# Patient Record
Sex: Female | Born: 1950
Health system: Southern US, Community
[De-identification: ages and names within clinical notes are randomized; demographics above are authoritative.]

## PROBLEM LIST (undated history)

## (undated) DIAGNOSIS — T7840XA Allergy, unspecified, initial encounter: Secondary | ICD-10-CM

## (undated) DIAGNOSIS — Z8719 Personal history of other diseases of the digestive system: Secondary | ICD-10-CM

## (undated) DIAGNOSIS — M1990 Unspecified osteoarthritis, unspecified site: Secondary | ICD-10-CM

## (undated) DIAGNOSIS — Z780 Asymptomatic menopausal state: Secondary | ICD-10-CM

## (undated) DIAGNOSIS — K219 Gastro-esophageal reflux disease without esophagitis: Secondary | ICD-10-CM

## (undated) DIAGNOSIS — E119 Type 2 diabetes mellitus without complications: Secondary | ICD-10-CM

## (undated) DIAGNOSIS — Z8711 Personal history of peptic ulcer disease: Secondary | ICD-10-CM

## (undated) DIAGNOSIS — H9190 Unspecified hearing loss, unspecified ear: Secondary | ICD-10-CM

## (undated) DIAGNOSIS — M810 Age-related osteoporosis without current pathological fracture: Secondary | ICD-10-CM

## (undated) DIAGNOSIS — E785 Hyperlipidemia, unspecified: Secondary | ICD-10-CM

## (undated) DIAGNOSIS — I10 Essential (primary) hypertension: Secondary | ICD-10-CM

## (undated) HISTORY — DX: Personal history of peptic ulcer disease: Z87.11

## (undated) HISTORY — DX: Allergy, unspecified, initial encounter: T78.40XA

## (undated) HISTORY — DX: Essential (primary) hypertension: I10

## (undated) HISTORY — DX: Personal history of other diseases of the digestive system: Z87.19

## (undated) HISTORY — DX: Unspecified osteoarthritis, unspecified site: M19.90

## (undated) HISTORY — PX: BREAST SURGERY: SHX581

## (undated) HISTORY — DX: Gastro-esophageal reflux disease without esophagitis: K21.9

## (undated) HISTORY — DX: Hyperlipidemia, unspecified: E78.5

## (undated) HISTORY — DX: Asymptomatic menopausal state: Z78.0

## (undated) HISTORY — DX: Age-related osteoporosis without current pathological fracture: M81.0

## (undated) HISTORY — DX: Unspecified hearing loss, unspecified ear: H91.90

## (undated) HISTORY — DX: Type 2 diabetes mellitus without complications: E11.9

---

## 1984-10-26 HISTORY — PX: TUBAL LIGATION: SHX77

## 2008-01-25 HISTORY — PX: COLONOSCOPY: SHX174

## 2008-10-26 HISTORY — PX: CARDIAC CATHETERIZATION: SHX172

## 2014-10-04 ENCOUNTER — Encounter: Payer: Self-pay | Admitting: Family Medicine

## 2015-11-19 DIAGNOSIS — R0602 Shortness of breath: Secondary | ICD-10-CM | POA: Diagnosis not present

## 2015-12-12 DIAGNOSIS — E7800 Pure hypercholesterolemia, unspecified: Secondary | ICD-10-CM | POA: Diagnosis not present

## 2015-12-12 DIAGNOSIS — R7303 Prediabetes: Secondary | ICD-10-CM | POA: Diagnosis not present

## 2015-12-12 DIAGNOSIS — R0602 Shortness of breath: Secondary | ICD-10-CM | POA: Diagnosis not present

## 2015-12-12 DIAGNOSIS — Z8249 Family history of ischemic heart disease and other diseases of the circulatory system: Secondary | ICD-10-CM | POA: Diagnosis not present

## 2016-03-03 DIAGNOSIS — W57XXXA Bitten or stung by nonvenomous insect and other nonvenomous arthropods, initial encounter: Secondary | ICD-10-CM | POA: Diagnosis not present

## 2016-03-03 DIAGNOSIS — M2550 Pain in unspecified joint: Secondary | ICD-10-CM | POA: Diagnosis not present

## 2016-03-03 DIAGNOSIS — S30861A Insect bite (nonvenomous) of abdominal wall, initial encounter: Secondary | ICD-10-CM | POA: Diagnosis not present

## 2016-03-17 DIAGNOSIS — Z86010 Personal history of colonic polyps: Secondary | ICD-10-CM | POA: Diagnosis not present

## 2016-03-17 DIAGNOSIS — Z1211 Encounter for screening for malignant neoplasm of colon: Secondary | ICD-10-CM | POA: Diagnosis not present

## 2016-03-17 DIAGNOSIS — Z800 Family history of malignant neoplasm of digestive organs: Secondary | ICD-10-CM | POA: Diagnosis not present

## 2016-03-17 DIAGNOSIS — R194 Change in bowel habit: Secondary | ICD-10-CM | POA: Diagnosis not present

## 2016-03-17 DIAGNOSIS — R152 Fecal urgency: Secondary | ICD-10-CM | POA: Diagnosis not present

## 2016-04-07 ENCOUNTER — Emergency Department (HOSPITAL_COMMUNITY)
Admission: EM | Admit: 2016-04-07 | Discharge: 2016-04-07 | Disposition: A | Discharge status: Home / Self Care | Payer: PPO | Attending: Emergency Medicine | Admitting: Emergency Medicine

## 2016-04-07 ENCOUNTER — Emergency Department (HOSPITAL_COMMUNITY): Payer: PPO

## 2016-04-07 ENCOUNTER — Encounter (HOSPITAL_COMMUNITY): Payer: Self-pay | Admitting: Emergency Medicine

## 2016-04-07 DIAGNOSIS — W540XXA Bitten by dog, initial encounter: Secondary | ICD-10-CM | POA: Diagnosis not present

## 2016-04-07 DIAGNOSIS — S81852A Open bite, left lower leg, initial encounter: Secondary | ICD-10-CM | POA: Diagnosis not present

## 2016-04-07 DIAGNOSIS — S81812A Laceration without foreign body, left lower leg, initial encounter: Secondary | ICD-10-CM | POA: Insufficient documentation

## 2016-04-07 DIAGNOSIS — Y999 Unspecified external cause status: Secondary | ICD-10-CM | POA: Insufficient documentation

## 2016-04-07 DIAGNOSIS — E119 Type 2 diabetes mellitus without complications: Secondary | ICD-10-CM | POA: Insufficient documentation

## 2016-04-07 DIAGNOSIS — Y929 Unspecified place or not applicable: Secondary | ICD-10-CM | POA: Insufficient documentation

## 2016-04-07 DIAGNOSIS — I10 Essential (primary) hypertension: Secondary | ICD-10-CM | POA: Diagnosis not present

## 2016-04-07 DIAGNOSIS — M810 Age-related osteoporosis without current pathological fracture: Secondary | ICD-10-CM | POA: Diagnosis not present

## 2016-04-07 DIAGNOSIS — E785 Hyperlipidemia, unspecified: Secondary | ICD-10-CM | POA: Insufficient documentation

## 2016-04-07 DIAGNOSIS — S80879A Other superficial bite, unspecified lower leg, initial encounter: Secondary | ICD-10-CM | POA: Diagnosis not present

## 2016-04-07 DIAGNOSIS — Y939 Activity, unspecified: Secondary | ICD-10-CM | POA: Diagnosis not present

## 2016-04-07 MED ORDER — HYDROMORPHONE HCL 1 MG/ML IJ SOLN
INTRAMUSCULAR | Status: AC
  Administered 2016-04-07: 1 mg
  Filled 2016-04-07: qty 1

## 2016-04-07 MED ORDER — TETANUS-DIPHTH-ACELL PERTUSSIS 5-2.5-18.5 LF-MCG/0.5 IM SUSP
0.5000 mL | Freq: Once | INTRAMUSCULAR | Status: AC
  Administered 2016-04-07: 0.5 mL via INTRAMUSCULAR
  Filled 2016-04-07: qty 0.5

## 2016-04-07 MED ORDER — SODIUM CHLORIDE 0.9 % IV SOLN
3.0000 g | Freq: Once | INTRAVENOUS | Status: AC
  Administered 2016-04-07: 3 g via INTRAVENOUS
  Filled 2016-04-07: qty 3

## 2016-04-07 MED ORDER — BUPIVACAINE HCL (PF) 0.5 % IJ SOLN
INTRAMUSCULAR | Status: AC
  Administered 2016-04-07: 20:00:00
  Filled 2016-04-07: qty 30

## 2016-04-07 MED ORDER — HYDROCODONE-ACETAMINOPHEN 5-325 MG PO TABS: 1.0000 | ORAL_TABLET | ORAL | Status: DC | PRN

## 2016-04-07 MED ORDER — BUPIVACAINE HCL 0.5 % IJ SOLN
50.0000 mL | Freq: Once | INTRAMUSCULAR | Status: DC
  Filled 2016-04-07: qty 50

## 2016-04-07 MED ORDER — AMOXICILLIN-POT CLAVULANATE 875-125 MG PO TABS: 1.0000 | ORAL_TABLET | Freq: Two times a day (BID) | ORAL | Status: DC

## 2016-04-07 MED ORDER — BUPIVACAINE HCL (PF) 0.5 % IJ SOLN
INTRAMUSCULAR | Status: AC
  Administered 2016-04-07: 21:00:00
  Filled 2016-04-07: qty 30

## 2016-04-07 MED ORDER — LORAZEPAM 2 MG/ML IJ SOLN
1.0000 mg | Freq: Once | INTRAMUSCULAR | Status: AC
  Administered 2016-04-07: 1 mg via INTRAVENOUS
  Filled 2016-04-07: qty 1

## 2016-04-07 NOTE — ED Notes (Signed)
Pt reports being bitten by her dog to her LT leg. EMS reports that pt has one large laceration and 1 puncture wound. Bleeding controlled with pressure.

## 2016-04-07 NOTE — Discharge Instructions (Signed)
Please return to the emergency department to have your wound check in 3 days. Please have the staples removed in 10 days. Please use Augmentin 2 times daily with food. Use Tylenol or ibuprofen for mild pain, use Norco for more severe pain. Norco may cause drowsiness, please use this medication with caution. Animal Bite Animal bite wounds can get infected. It is important to get proper medical treatment. Ask your doctor if you need rabies treatment. HOME CARE  Wound Care  Follow instructions from your doctor about how to take care of your wound. Make sure you:  Wash your hands with soap and water before you change your bandage (dressing). If you cannot use soap and water, use hand sanitizer.  Change your bandage as told by your doctor.  Leave stitches (sutures), skin glue, or skin tape (adhesive) strips in place. They may need to stay in place for 2 weeks or longer. If tape strips get loose and curl up, you may trim the loose edges. Do not remove tape strips completely unless your doctor says it is okay.  Check your wound every day for signs of infection. Watch for:    Redness, swelling, or pain that gets worse.    Fluid, blood, or pus.  General Instructions  Take or apply over-the-counter and prescription medicines only as told by your doctor.   If you were prescribed an antibiotic, take or apply it as told by your doctor. Do not stop using the antibiotic even if your condition improves.   Keep the injured area raised (elevated) above the level of your heart while you are sitting or lying down.  If directed, apply ice to the injured area.    Put ice in a plastic bag.    Place a towel between your skin and the bag.    Leave the ice on for 20 minutes, 2-3 times per day.   Keep all follow-up visits as told by your doctor. This is important.  GET HELP IF:  You have redness, swelling, or pain that gets worse.   You have a general feeling of sickness (malaise).    You feel sick to your stomach (nauseous).  You throw up (vomit).   You have pain that does not get better.  GET HELP RIGHT AWAY IF:   You have a red streak going away from your wound.   You have fluid, blood, or pus coming from your wound.   You have a fever or chills.   You have trouble moving your injured area.   You have numbness or tingling anywhere on your body.    This information is not intended to replace advice given to you by your health care provider. Make sure you discuss any questions you have with your health care provider.   Document Released: 10/12/2005 Document Revised: 07/03/2015 Document Reviewed: 02/27/2015 Elsevier Interactive Patient Education [employer].

## 2016-04-07 NOTE — ED Notes (Signed)
Pt alert & oriented x4, stable gait. Patient given discharge instructions, paperwork & prescription(s). Patient informed not to drive, operate any equipment & handel any important documents 4 hours after taking pain medication. Patient  instructed to stop at the registration desk to finish any additional paperwork. Patient  verbalized understanding. Pt left department w/ no further questions. 

## 2016-04-08 NOTE — ED Provider Notes (Signed)
CSN: [id number]     Arrival date & time 04/07/16  1727 History   First MD Initiated Contact with Patient 04/07/16 1742     Chief Complaint  Patient presents with  . Animal Bite     (Consider location/radiation/quality/duration/timing/severity/associated sxs/prior Treatment) Patient is a 65 y.o. female presenting with animal bite. The history is provided by the patient (Patient states that her own dog bit her on the left leg. Physical car today).  Animal Bite Contact animal:  Dog Pain details:    Quality:  Aching   Severity:  Moderate   Timing:  Constant   Progression:  Unchanged Incident location:  Home Provoked: Patient was trying to break up a fight amongst the dog's.   Notifications:  None Animal's rabies vaccination status:  Up to date Associated symptoms: no rash     Past Medical History  Diagnosis Date  . Diabetes mellitus without complication (HCC)     prediabetes  . Hypertension   . Hyperlipidemia   . Neuromuscular disorder ([place])   . Osteoporosis   . History of stomach ulcers   . Arthritis   . Menopause   . Hearing loss    Past Surgical History  Procedure Laterality Date  . Colon surgery  2010    colonoscopy every 5 years  . Tubal ligation  1986   Family History  Problem Relation Age of Onset  . Arthritis Mother   . Cancer Mother   . Early death Mother   . Heart disease Mother   . Hyperlipidemia Mother   . Hypertension Mother   . Miscarriages / [place] Mother   . Stroke Mother   . Heart disease Father   . Arthritis Sister   . Depression Sister   . Early death Sister   . Heart disease Sister   . Hyperlipidemia Sister   . Alcohol abuse Brother   . Early death Brother   . Diabetes Son   . Arthritis Maternal Grandmother   . Heart disease Maternal Grandmother   . Arthritis Maternal Grandfather   . Heart disease Maternal Grandfather   . Arthritis Paternal Grandmother   . Heart disease Paternal Grandmother   . Arthritis Paternal Grandfather    . Heart disease Paternal Grandfather   . Asthma Sister   . Cancer Sister   . Depression Sister   . Hyperlipidemia Sister   . Stroke Sister   . Cancer Sister   . Depression Sister   . Hyperlipidemia Sister   . COPD Sister   . Depression Sister   . Hyperlipidemia Sister    Social History  Substance Use Topics  . Smoking status: Never Smoker   . Smokeless tobacco: None  . Alcohol Use: No   OB History    No data available     Review of Systems  Constitutional: Negative for appetite change and fatigue.  HENT: Negative for congestion, ear discharge and sinus pressure.   Eyes: Negative for discharge.  Respiratory: Negative for cough.   Cardiovascular: Negative for chest pain.  Gastrointestinal: Negative for abdominal pain and diarrhea.  Genitourinary: Negative for frequency and hematuria.  Musculoskeletal: Negative for back pain.       Laceration left lower leg  Skin: Negative for rash.  Neurological: Negative for seizures and headaches.  Psychiatric/Behavioral: Negative for hallucinations.      Allergies  Percocet  Home Medications   Prior to Admission medications   Medication Sig Start Date End Date Taking? Authorizing Provider  alendronate (FOSAMAX) 5 MG  tablet Take 5 mg by mouth once a week. Take with a full glass of water on an empty stomach.    Historical Provider, MD  amoxicillin-clavulanate (AUGMENTIN) 875-125 MG tablet Take 1 tablet by mouth 2 (two) times daily. 04/07/16   Lily Kocher, PA-C  aspirin 325 MG tablet Take 325 mg by mouth every 6 (six) hours as needed.    Historical Provider, MD  calcium-vitamin D (OSCAL WITH D) 500-200 MG-UNIT per tablet Take 1 tablet by mouth.    Historical Provider, MD  HYDROcodone-acetaminophen (NORCO/VICODIN) 5-325 MG tablet Take 1-2 tablets by mouth every 4 (four) hours as needed. 04/07/16   Lily Kocher, PA-C  HYDROcodone-acetaminophen (NORCO/VICODIN) 5-325 MG tablet Take 1 tablet by mouth every 4 (four) hours as needed.  04/07/16   Lily Kocher, PA-C  Multiple Vitamin (MULTIVITAMIN) capsule Take 1 capsule by mouth daily.    Historical Provider, MD  traMADol (ULTRAM) 50 MG tablet Take by mouth every 6 (six) hours as needed.    Historical Provider, MD   BP 132/78 mmHg  Pulse 68  Temp(Src) 98.7 F (37.1 C) (Oral)  Resp 17  Ht 5\' 5"  (1.651 m)  Wt 164 lb (74.39 kg)  BMI 27.29 kg/m2  SpO2 99% Physical Exam  Constitutional: She is oriented to person, place, and time. She appears well-developed.  HENT:  Head: Normocephalic.  Eyes: Conjunctivae are normal.  Neck: No tracheal deviation present.  Cardiovascular:  No murmur heard. Musculoskeletal: Normal range of motion.  Patient has a large superficial laceration to left calf. It is approximately 6 cm. Neurovascular exam normal  Neurological: She is oriented to person, place, and time.  Skin: Skin is warm.  Psychiatric: She has a normal mood and affect.    ED Course  Procedures (including critical care time) Labs Review Labs Reviewed - No data to display  Imaging Review Dg Tibia/fibula Left  04/07/2016  CLINICAL DATA:  Pt reports being bitten by her dog to her LT leg. EMS reports that pt has one large laceration and 1 puncture wound. Bleeding controlled with pressure EXAM: LEFT TIBIA AND FIBULA - 2 VIEW COMPARISON:  None. FINDINGS: There is a soft tissue wound along the posterior aspect of the lower calf. No radiopaque foreign body. No fracture.  No bone lesion. Knee and ankle joints are normally aligned. IMPRESSION: 1. No fracture or dislocation. 2. No radiopaque foreign body. Electronically Signed   By: Lajean Manes M.D.   On: 04/07/2016 18:51   I have personally reviewed and evaluated these images and lab results as part of my medical decision-making.   EKG Interpretation None      MDM   Final diagnoses:  Animal bite of lower leg, left, initial encounter    Patient with large laceration from dog bite. Laceration is to left calf. Area was  cleaned thoroughly laceration was closed by physician assistant Lily Kocher. Patient was put on antibiotics and is to follow-up in a couple days for recheck    Milton Ferguson, MD 04/08/16 1207

## 2016-04-09 NOTE — ED Provider Notes (Signed)
Wound Repairs Left Leg. Patient is a 65 year old female who sustained dog bites/punctures to the left lower leg.  I reviewed the x-rays. There is no bone involvement. There is no radiopaque foreign body present.  The procedure was explained to the patient in terms which she understands. She is admission to proceed with the procedure. The patient was somewhat anxious and was given a milligram of Ativan prior to the procedure starting.  The patient was identified by arm band. The 3 lacerations/wound areas were irrigated with a liter of saline. They were then painted with Betadine. The wounds were infiltrated with 0.5% bupivacaine. Following this the wounds were scrubbed with surgical brush. And then irrigated a second time.  The first laceration measured 2.2 cm. It was explored to rule out foreign body. There is no tendon involvement. Using sterile technique the wound was repaired with 3 staples.  The second laceration is measured 12.2 cm. There was a wedge of skin that was a fall. During the bite. There is no bone involvement or tendon involvement. No foreign body found. The wound was repaired with a 23 staples using sterile technique..  The third wound measured 2.7 cm. There was no bone bone or tendon involvement. No foreign body noted on evaluation on. Using sterile technique it was repaired with 7 staples.  The wounds were dressed with nonstick dressings on. The patient will be treated with Augmentin, and Norco. The patient is to be rechecked here in the emergency department in 3 days, and have the staples removed in 10 days. Patient tolerated the procedure without problem.   Lily Kocher, PA-C 04/09/16 2212  Milton Ferguson, MD 04/15/16 [phone]

## 2016-04-10 ENCOUNTER — Emergency Department (HOSPITAL_COMMUNITY)
Admission: EM | Admit: 2016-04-10 | Discharge: 2016-04-10 | Disposition: A | Discharge status: Home / Self Care | Payer: PPO | Attending: Emergency Medicine | Admitting: Emergency Medicine

## 2016-04-10 ENCOUNTER — Encounter (HOSPITAL_COMMUNITY): Payer: Self-pay

## 2016-04-10 DIAGNOSIS — Z4802 Encounter for removal of sutures: Secondary | ICD-10-CM | POA: Diagnosis not present

## 2016-04-10 DIAGNOSIS — Z4801 Encounter for change or removal of surgical wound dressing: Secondary | ICD-10-CM | POA: Insufficient documentation

## 2016-04-10 DIAGNOSIS — Z5189 Encounter for other specified aftercare: Secondary | ICD-10-CM

## 2016-04-10 NOTE — ED Provider Notes (Signed)
CSN: [id number]     Arrival date & time 04/10/16  1759 History   First MD Initiated Contact with Patient 04/10/16 1937     Chief Complaint  Patient presents with  . Wound Check     (Consider location/radiation/quality/duration/timing/severity/associated sxs/prior Treatment) HPI Kelsey Cook is a 65 y.o. female who presents to the ED for wound check 3 days after she was bitten by a dog. She was instructed to return. She has multiple staples to the left lower leg. She denies any problems such as fever, increased pain or redness. She does have swelling to the lower leg but states that is because she has been sitting in the waiting room with the leg down.   Past Medical History  Diagnosis Date  . Diabetes mellitus without complication (HCC)     prediabetes  . Hypertension   . Hyperlipidemia   . Neuromuscular disorder ([place])   . Osteoporosis   . History of stomach ulcers   . Arthritis   . Menopause   . Hearing loss    Past Surgical History  Procedure Laterality Date  . Colon surgery  2010    colonoscopy every 5 years  . Tubal ligation  1986   Family History  Problem Relation Age of Onset  . Arthritis Mother   . Cancer Mother   . Early death Mother   . Heart disease Mother   . Hyperlipidemia Mother   . Hypertension Mother   . Miscarriages / [place] Mother   . Stroke Mother   . Heart disease Father   . Arthritis Sister   . Depression Sister   . Early death Sister   . Heart disease Sister   . Hyperlipidemia Sister   . Alcohol abuse Brother   . Early death Brother   . Diabetes Son   . Arthritis Maternal Grandmother   . Heart disease Maternal Grandmother   . Arthritis Maternal Grandfather   . Heart disease Maternal Grandfather   . Arthritis Paternal Grandmother   . Heart disease Paternal Grandmother   . Arthritis Paternal Grandfather   . Heart disease Paternal Grandfather   . Asthma Sister   . Cancer Sister   . Depression Sister   . Hyperlipidemia Sister   .  Stroke Sister   . Cancer Sister   . Depression Sister   . Hyperlipidemia Sister   . COPD Sister   . Depression Sister   . Hyperlipidemia Sister    Social History  Substance Use Topics  . Smoking status: Never Smoker   . Smokeless tobacco: None  . Alcohol Use: No   OB History    No data available     Review of Systems Negative except as stated in HPI   Allergies  Hydrocodone and Percocet  Home Medications   Prior to Admission medications   Medication Sig Start Date End Date Taking? Authorizing Provider  alendronate (FOSAMAX) 5 MG tablet Take 5 mg by mouth once a week. Take with a full glass of water on an empty stomach.    Historical Provider, MD  amoxicillin-clavulanate (AUGMENTIN) 875-125 MG tablet Take 1 tablet by mouth 2 (two) times daily. 04/07/16   Lily Kocher, PA-C  aspirin 325 MG tablet Take 325 mg by mouth every 6 (six) hours as needed.    Historical Provider, MD  calcium-vitamin D (OSCAL WITH D) 500-200 MG-UNIT per tablet Take 1 tablet by mouth.    Historical Provider, MD  HYDROcodone-acetaminophen (NORCO/VICODIN) 5-325 MG tablet Take 1-2 tablets by mouth  every 4 (four) hours as needed. 04/07/16   Lily Kocher, PA-C  HYDROcodone-acetaminophen (NORCO/VICODIN) 5-325 MG tablet Take 1 tablet by mouth every 4 (four) hours as needed. 04/07/16   Lily Kocher, PA-C  Multiple Vitamin (MULTIVITAMIN) capsule Take 1 capsule by mouth daily.    Historical Provider, MD  traMADol (ULTRAM) 50 MG tablet Take by mouth every 6 (six) hours as needed.    Historical Provider, MD   BP 133/85 mmHg  Pulse 96  Temp(Src) 97.9 F (36.6 C) (Oral)  Resp 18  Ht 5' 5.5" (1.664 m)  Wt 69.854 kg  BMI 25.23 kg/m2  SpO2 93% Physical Exam  Constitutional: She is oriented to person, place, and time. She appears well-developed and well-nourished.  HENT:  Head: Normocephalic and atraumatic.  Eyes: Conjunctivae and EOM are normal.  Neck: Neck supple.  Cardiovascular: Normal rate.    Pulmonary/Chest: Effort normal.  Musculoskeletal:       Left lower leg: She exhibits laceration.  Staples in place left lower leg. There is edema noted but no erythema or red streaking. Pedal pulse 2+, adequate circulation.   Neurological: She is alert and oriented to person, place, and time. No cranial nerve deficit.  Skin: Skin is warm and dry.  Psychiatric: She has a normal mood and affect. Her behavior is normal.  Nursing note and vitals reviewed.   ED Course  Procedures (including critical care time) Labs Review Labs Reviewed - No data to display   MDM  65 y.o. female here for wound check stable for d/c without fever or other signs of infection. She will continue her Augmentin and return as scheduled for staple removal. She will return sooner for any problems.   Final diagnoses:  Encounter for wound re-check        Ashley Murrain, NP 04/11/16 [id number]  Ezequiel Essex, MD 04/12/16 [phone]

## 2016-04-10 NOTE — ED Notes (Signed)
Pt states bloody drainage from staples, denies pus. Swelling noted to ankle, pt states she has left leg in dependent position while waiting and has been walking today.

## 2016-04-10 NOTE — ED Notes (Signed)
Pt reports was bit by her dog 3 days ago and has multiple laceration to left lower leg that was closed with staples.  Areas are a little red and slightly swollen but no drainage noted.

## 2016-04-10 NOTE — Discharge Instructions (Signed)
Continue to take the antibiotic and ibuprofen. Clean the wound as directed. Return immediately for fever, increased redness, warmth or red streaking or other problems.  Follow up in 7 days for staple removal.

## 2016-04-13 DIAGNOSIS — K5190 Ulcerative colitis, unspecified, without complications: Secondary | ICD-10-CM | POA: Diagnosis not present

## 2016-04-13 DIAGNOSIS — K625 Hemorrhage of anus and rectum: Secondary | ICD-10-CM | POA: Diagnosis not present

## 2016-04-17 ENCOUNTER — Emergency Department (HOSPITAL_COMMUNITY)
Admission: EM | Admit: 2016-04-17 | Discharge: 2016-04-17 | Disposition: A | Discharge status: Home / Self Care | Payer: PPO | Attending: Emergency Medicine | Admitting: Emergency Medicine

## 2016-04-17 ENCOUNTER — Encounter (HOSPITAL_COMMUNITY): Payer: Self-pay | Admitting: Emergency Medicine

## 2016-04-17 DIAGNOSIS — Z4802 Encounter for removal of sutures: Secondary | ICD-10-CM

## 2016-04-17 DIAGNOSIS — Z79899 Other long term (current) drug therapy: Secondary | ICD-10-CM | POA: Insufficient documentation

## 2016-04-17 DIAGNOSIS — Z7982 Long term (current) use of aspirin: Secondary | ICD-10-CM | POA: Diagnosis not present

## 2016-04-17 DIAGNOSIS — I10 Essential (primary) hypertension: Secondary | ICD-10-CM | POA: Insufficient documentation

## 2016-04-17 DIAGNOSIS — E785 Hyperlipidemia, unspecified: Secondary | ICD-10-CM | POA: Insufficient documentation

## 2016-04-17 MED ORDER — BACITRACIN ZINC 500 UNIT/GM EX OINT
TOPICAL_OINTMENT | CUTANEOUS | Status: AC
  Filled 2016-04-17: qty 2.7

## 2016-04-17 MED ORDER — AMOXICILLIN-POT CLAVULANATE 875-125 MG PO TABS: 1.0000 | ORAL_TABLET | Freq: Two times a day (BID) | ORAL | Status: DC

## 2016-04-17 NOTE — ED Notes (Signed)
33 staples removed from left leg.  Wound irrigated with saline and cleaned with wound cleanser.  Pt tolerated well.

## 2016-04-17 NOTE — ED Provider Notes (Signed)
CSN: [id number]     Arrival date & time 04/17/16  0907 History   First MD Initiated Contact with Patient 04/17/16 [phone]     Chief Complaint  Patient presents with  . Suture / Staple Removal     (Consider location/radiation/quality/duration/timing/severity/associated sxs/prior Treatment) Patient is a 65 y.o. female presenting with suture removal. The history is provided by the patient. No language interpreter was used.  Suture / Staple Removal This is a recurrent problem. The current episode started 1 to 4 weeks ago. The problem occurs constantly. The problem has been unchanged. Nothing aggravates the symptoms. She has tried nothing for the symptoms. The treatment provided moderate relief.  Pt here for staple removal.  No complaints.   Past Medical History  Diagnosis Date  . Diabetes mellitus without complication (HCC)     prediabetes  . Hypertension   . Hyperlipidemia   . Neuromuscular disorder ([place])   . Osteoporosis   . History of stomach ulcers   . Arthritis   . Menopause   . Hearing loss    Past Surgical History  Procedure Laterality Date  . Colon surgery  2010    colonoscopy every 5 years  . Tubal ligation  1986   Family History  Problem Relation Age of Onset  . Arthritis Mother   . Cancer Mother   . Early death Mother   . Heart disease Mother   . Hyperlipidemia Mother   . Hypertension Mother   . Miscarriages / [place] Mother   . Stroke Mother   . Heart disease Father   . Arthritis Sister   . Depression Sister   . Early death Sister   . Heart disease Sister   . Hyperlipidemia Sister   . Alcohol abuse Brother   . Early death Brother   . Diabetes Son   . Arthritis Maternal Grandmother   . Heart disease Maternal Grandmother   . Arthritis Maternal Grandfather   . Heart disease Maternal Grandfather   . Arthritis Paternal Grandmother   . Heart disease Paternal Grandmother   . Arthritis Paternal Grandfather   . Heart disease Paternal Grandfather   . Asthma  Sister   . Cancer Sister   . Depression Sister   . Hyperlipidemia Sister   . Stroke Sister   . Cancer Sister   . Depression Sister   . Hyperlipidemia Sister   . COPD Sister   . Depression Sister   . Hyperlipidemia Sister    Social History  Substance Use Topics  . Smoking status: Never Smoker   . Smokeless tobacco: None  . Alcohol Use: No   OB History    No data available     Review of Systems  All other systems reviewed and are negative.     Allergies  Hydrocodone and Percocet  Home Medications   Prior to Admission medications   Medication Sig Start Date End Date Taking? Authorizing Provider  alendronate (FOSAMAX) 5 MG tablet Take 5 mg by mouth once a week. Take with a full glass of water on an empty stomach.    Historical Provider, MD  amoxicillin-clavulanate (AUGMENTIN) 875-125 MG tablet Take 1 tablet by mouth 2 (two) times daily. 04/17/16   Fransico Meadow, PA-C  aspirin 325 MG tablet Take 325 mg by mouth every 6 (six) hours as needed.    Historical Provider, MD  calcium-vitamin D (OSCAL WITH D) 500-200 MG-UNIT per tablet Take 1 tablet by mouth.    Historical Provider, MD  HYDROcodone-acetaminophen (NORCO/VICODIN) 5-325  MG tablet Take 1-2 tablets by mouth every 4 (four) hours as needed. 04/07/16   Lily Kocher, PA-C  HYDROcodone-acetaminophen (NORCO/VICODIN) 5-325 MG tablet Take 1 tablet by mouth every 4 (four) hours as needed. 04/07/16   Lily Kocher, PA-C  Multiple Vitamin (MULTIVITAMIN) capsule Take 1 capsule by mouth daily.    Historical Provider, MD  traMADol (ULTRAM) 50 MG tablet Take by mouth every 6 (six) hours as needed.    Historical Provider, MD   BP 135/83 mmHg  Pulse 76  Temp(Src) 98.2 F (36.8 C) (Oral)  Resp 18  Ht 5\' 5"  (1.651 m)  Wt 74.844 kg  BMI 27.46 kg/m2  SpO2 99% Physical Exam  Constitutional: She is oriented to person, place, and time. She appears well-developed and well-nourished.  HENT:  Head: Normocephalic.  Eyes: EOM are normal.   Neck: Normal range of motion.  Pulmonary/Chest: Effort normal.  Abdominal: She exhibits no distension.  Musculoskeletal:  Healing laceration left leg,  Some erythema around stapled site.    Neurological: She is alert and oriented to person, place, and time.  Skin: Skin is warm.  Psychiatric: She has a normal mood and affect.  Nursing note and vitals reviewed.   ED Course  Procedures (including critical care time) Labs Review Labs Reviewed - No data to display  Imaging Review No results found. I have personally reviewed and evaluated these images and lab results as part of my medical decision-making.   EKG Interpretation None      MDM staples removed by Rn.     Final diagnoses:  Visit for suture removal   Meds ordered this encounter  Medications  . amoxicillin-clavulanate (AUGMENTIN) 875-125 MG tablet    Sig: Take 1 tablet by mouth 2 (two) times daily.    Dispense:  10 tablet    Refill:  0    Order Specific Question:  Supervising Provider    Answer:  Sabra Heck, BRIAN [[id number]]  . bacitracin 500 UNIT/GM ointment    Sig:     Sofie Rower   : cabinet override      Fransico Meadow, PA-C 04/17/16 [place], DO 04/22/16 1245

## 2016-04-17 NOTE — ED Notes (Signed)
Pt states she is here for staple removal from left leg.

## 2016-04-17 NOTE — Discharge Instructions (Signed)

## 2016-04-20 MED FILL — Hydrocodone-Acetaminophen Tab 5-325 MG: ORAL | Qty: 6 | Status: AC

## 2016-07-21 DIAGNOSIS — R21 Rash and other nonspecific skin eruption: Secondary | ICD-10-CM | POA: Diagnosis not present

## 2016-07-25 DIAGNOSIS — L239 Allergic contact dermatitis, unspecified cause: Secondary | ICD-10-CM | POA: Diagnosis not present

## 2016-07-25 DIAGNOSIS — R030 Elevated blood-pressure reading, without diagnosis of hypertension: Secondary | ICD-10-CM | POA: Diagnosis not present

## 2016-07-27 ENCOUNTER — Ambulatory Visit: Payer: PPO | Admitting: Nurse Practitioner

## 2016-11-23 DIAGNOSIS — Z803 Family history of malignant neoplasm of breast: Secondary | ICD-10-CM | POA: Diagnosis not present

## 2016-11-23 DIAGNOSIS — Z1231 Encounter for screening mammogram for malignant neoplasm of breast: Secondary | ICD-10-CM | POA: Diagnosis not present

## 2017-01-14 DIAGNOSIS — N945 Secondary dysmenorrhea: Secondary | ICD-10-CM | POA: Diagnosis not present

## 2017-01-14 DIAGNOSIS — R319 Hematuria, unspecified: Secondary | ICD-10-CM | POA: Diagnosis not present

## 2017-01-14 DIAGNOSIS — N3945 Continuous leakage: Secondary | ICD-10-CM | POA: Diagnosis not present

## 2017-01-14 DIAGNOSIS — N762 Acute vulvitis: Secondary | ICD-10-CM | POA: Diagnosis not present

## 2017-01-14 DIAGNOSIS — Z01419 Encounter for gynecological examination (general) (routine) without abnormal findings: Secondary | ICD-10-CM | POA: Diagnosis not present

## 2017-01-14 DIAGNOSIS — R350 Frequency of micturition: Secondary | ICD-10-CM | POA: Diagnosis not present

## 2017-01-14 DIAGNOSIS — N9089 Other specified noninflammatory disorders of vulva and perineum: Secondary | ICD-10-CM | POA: Diagnosis not present

## 2017-01-14 DIAGNOSIS — Z6826 Body mass index (BMI) 26.0-26.9, adult: Secondary | ICD-10-CM | POA: Diagnosis not present

## 2017-01-26 DIAGNOSIS — R87810 Cervical high risk human papillomavirus (HPV) DNA test positive: Secondary | ICD-10-CM | POA: Diagnosis not present

## 2017-01-26 DIAGNOSIS — N870 Mild cervical dysplasia: Secondary | ICD-10-CM | POA: Diagnosis not present

## 2017-02-15 DIAGNOSIS — M8588 Other specified disorders of bone density and structure, other site: Secondary | ICD-10-CM | POA: Diagnosis not present

## 2017-02-15 DIAGNOSIS — R300 Dysuria: Secondary | ICD-10-CM | POA: Diagnosis not present

## 2017-02-15 DIAGNOSIS — N390 Urinary tract infection, site not specified: Secondary | ICD-10-CM | POA: Diagnosis not present

## 2017-02-15 DIAGNOSIS — N958 Other specified menopausal and perimenopausal disorders: Secondary | ICD-10-CM | POA: Diagnosis not present

## 2017-02-24 DIAGNOSIS — M810 Age-related osteoporosis without current pathological fracture: Secondary | ICD-10-CM | POA: Diagnosis not present

## 2017-02-24 DIAGNOSIS — Z1389 Encounter for screening for other disorder: Secondary | ICD-10-CM | POA: Diagnosis not present

## 2017-02-24 DIAGNOSIS — Z8249 Family history of ischemic heart disease and other diseases of the circulatory system: Secondary | ICD-10-CM | POA: Diagnosis not present

## 2017-02-24 DIAGNOSIS — E785 Hyperlipidemia, unspecified: Secondary | ICD-10-CM | POA: Diagnosis not present

## 2017-02-24 DIAGNOSIS — Z6827 Body mass index (BMI) 27.0-27.9, adult: Secondary | ICD-10-CM | POA: Diagnosis not present

## 2017-02-24 DIAGNOSIS — K219 Gastro-esophageal reflux disease without esophagitis: Secondary | ICD-10-CM | POA: Diagnosis not present

## 2017-02-24 DIAGNOSIS — Z800 Family history of malignant neoplasm of digestive organs: Secondary | ICD-10-CM | POA: Diagnosis not present

## 2017-02-24 DIAGNOSIS — I10 Essential (primary) hypertension: Secondary | ICD-10-CM | POA: Diagnosis not present

## 2017-03-16 DIAGNOSIS — Z86010 Personal history of colonic polyps: Secondary | ICD-10-CM | POA: Diagnosis not present

## 2017-03-16 DIAGNOSIS — Z800 Family history of malignant neoplasm of digestive organs: Secondary | ICD-10-CM | POA: Diagnosis not present

## 2017-03-16 DIAGNOSIS — Z1211 Encounter for screening for malignant neoplasm of colon: Secondary | ICD-10-CM | POA: Diagnosis not present

## 2017-03-16 DIAGNOSIS — K219 Gastro-esophageal reflux disease without esophagitis: Secondary | ICD-10-CM | POA: Diagnosis not present

## 2017-04-26 DIAGNOSIS — S0006XA Insect bite (nonvenomous) of scalp, initial encounter: Secondary | ICD-10-CM | POA: Diagnosis not present

## 2017-04-26 DIAGNOSIS — W57XXXA Bitten or stung by nonvenomous insect and other nonvenomous arthropods, initial encounter: Secondary | ICD-10-CM | POA: Diagnosis not present

## 2017-05-04 DIAGNOSIS — R8299 Other abnormal findings in urine: Secondary | ICD-10-CM | POA: Diagnosis not present

## 2017-05-04 DIAGNOSIS — E784 Other hyperlipidemia: Secondary | ICD-10-CM | POA: Diagnosis not present

## 2017-05-04 DIAGNOSIS — I10 Essential (primary) hypertension: Secondary | ICD-10-CM | POA: Diagnosis not present

## 2017-05-04 DIAGNOSIS — M810 Age-related osteoporosis without current pathological fracture: Secondary | ICD-10-CM | POA: Diagnosis not present

## 2017-05-05 DIAGNOSIS — Z1212 Encounter for screening for malignant neoplasm of rectum: Secondary | ICD-10-CM | POA: Diagnosis not present

## 2017-05-07 DIAGNOSIS — E784 Other hyperlipidemia: Secondary | ICD-10-CM | POA: Diagnosis not present

## 2017-05-07 DIAGNOSIS — Z0000 Encounter for general adult medical examination without abnormal findings: Secondary | ICD-10-CM | POA: Diagnosis not present

## 2017-05-07 DIAGNOSIS — L258 Unspecified contact dermatitis due to other agents: Secondary | ICD-10-CM | POA: Diagnosis not present

## 2017-05-07 DIAGNOSIS — Z8249 Family history of ischemic heart disease and other diseases of the circulatory system: Secondary | ICD-10-CM | POA: Diagnosis not present

## 2017-05-07 DIAGNOSIS — K219 Gastro-esophageal reflux disease without esophagitis: Secondary | ICD-10-CM | POA: Diagnosis not present

## 2017-05-07 DIAGNOSIS — I10 Essential (primary) hypertension: Secondary | ICD-10-CM | POA: Diagnosis not present

## 2017-05-07 DIAGNOSIS — M810 Age-related osteoporosis without current pathological fracture: Secondary | ICD-10-CM | POA: Diagnosis not present

## 2017-05-07 DIAGNOSIS — Z800 Family history of malignant neoplasm of digestive organs: Secondary | ICD-10-CM | POA: Diagnosis not present

## 2017-05-07 DIAGNOSIS — Z6827 Body mass index (BMI) 27.0-27.9, adult: Secondary | ICD-10-CM | POA: Diagnosis not present

## 2017-05-07 DIAGNOSIS — R509 Fever, unspecified: Secondary | ICD-10-CM | POA: Diagnosis not present

## 2017-08-05 DIAGNOSIS — S80212A Abrasion, left knee, initial encounter: Secondary | ICD-10-CM | POA: Diagnosis not present

## 2017-08-05 DIAGNOSIS — Z6826 Body mass index (BMI) 26.0-26.9, adult: Secondary | ICD-10-CM | POA: Diagnosis not present

## 2017-08-05 DIAGNOSIS — W01198A Fall on same level from slipping, tripping and stumbling with subsequent striking against other object, initial encounter: Secondary | ICD-10-CM | POA: Diagnosis not present

## 2017-09-24 DIAGNOSIS — J40 Bronchitis, not specified as acute or chronic: Secondary | ICD-10-CM | POA: Diagnosis not present

## 2017-09-24 DIAGNOSIS — R05 Cough: Secondary | ICD-10-CM | POA: Diagnosis not present

## 2017-09-24 DIAGNOSIS — M791 Myalgia, unspecified site: Secondary | ICD-10-CM | POA: Diagnosis not present

## 2017-09-24 DIAGNOSIS — Z79899 Other long term (current) drug therapy: Secondary | ICD-10-CM | POA: Diagnosis not present

## 2017-09-24 DIAGNOSIS — Z6826 Body mass index (BMI) 26.0-26.9, adult: Secondary | ICD-10-CM | POA: Diagnosis not present

## 2017-09-24 DIAGNOSIS — R52 Pain, unspecified: Secondary | ICD-10-CM | POA: Diagnosis not present

## 2017-12-27 DIAGNOSIS — M2550 Pain in unspecified joint: Secondary | ICD-10-CM | POA: Diagnosis not present

## 2017-12-27 DIAGNOSIS — Z6826 Body mass index (BMI) 26.0-26.9, adult: Secondary | ICD-10-CM | POA: Diagnosis not present

## 2017-12-27 DIAGNOSIS — I10 Essential (primary) hypertension: Secondary | ICD-10-CM | POA: Diagnosis not present

## 2017-12-27 DIAGNOSIS — M545 Low back pain: Secondary | ICD-10-CM | POA: Diagnosis not present

## 2017-12-27 DIAGNOSIS — M791 Myalgia, unspecified site: Secondary | ICD-10-CM | POA: Diagnosis not present

## 2017-12-27 DIAGNOSIS — M810 Age-related osteoporosis without current pathological fracture: Secondary | ICD-10-CM | POA: Diagnosis not present

## 2017-12-27 DIAGNOSIS — E7849 Other hyperlipidemia: Secondary | ICD-10-CM | POA: Diagnosis not present

## 2017-12-27 DIAGNOSIS — S32009A Unspecified fracture of unspecified lumbar vertebra, initial encounter for closed fracture: Secondary | ICD-10-CM | POA: Diagnosis not present

## 2018-01-04 DIAGNOSIS — M545 Low back pain: Secondary | ICD-10-CM | POA: Diagnosis not present

## 2018-01-25 ENCOUNTER — Telehealth (HOSPITAL_COMMUNITY): Payer: Self-pay

## 2018-01-25 NOTE — Telephone Encounter (Signed)
01/25/18  pt called and needed to reschedule had another appt at the same time

## 2018-01-26 ENCOUNTER — Ambulatory Visit (HOSPITAL_COMMUNITY): Payer: PPO

## 2018-01-26 ENCOUNTER — Encounter (HOSPITAL_COMMUNITY): Payer: Self-pay

## 2018-02-02 ENCOUNTER — Ambulatory Visit (HOSPITAL_COMMUNITY): Payer: PPO | Attending: Neurosurgery

## 2018-02-02 ENCOUNTER — Encounter (HOSPITAL_COMMUNITY): Payer: Self-pay

## 2018-02-02 DIAGNOSIS — M545 Low back pain, unspecified: Secondary | ICD-10-CM

## 2018-02-02 DIAGNOSIS — R29898 Other symptoms and signs involving the musculoskeletal system: Secondary | ICD-10-CM | POA: Diagnosis not present

## 2018-02-02 DIAGNOSIS — M6281 Muscle weakness (generalized): Secondary | ICD-10-CM | POA: Diagnosis not present

## 2018-02-02 DIAGNOSIS — G8929 Other chronic pain: Secondary | ICD-10-CM

## 2018-02-02 NOTE — Therapy (Signed)
[facility] [address] [place], [place], [address] Phone: [phone]   Fax:  [phone]  Physical Therapy Evaluation  Patient Details  Name: Kelsey Cook MRN: [id number] Date of Birth: [birth year] Referring Provider: Newman Pies, MD   Encounter Date: 02/02/2018  PT End of Session - 02/02/18 1211    Visit Number  1    Number of Visits  9    Date for PT Re-Evaluation  03/02/18    Authorization Type  [employer] (no visit limit, no auth required)    Authorization Time Period  02/02/18 to 03/02/18    PT Start Time  1126    PT Stop Time  1208    PT Time Calculation (min)  42 min    Activity Tolerance  Patient tolerated treatment well    Behavior During Therapy  [facility] for tasks assessed/performed       Past Medical History:  Diagnosis Date  . Arthritis   . Diabetes mellitus without complication (HCC)    prediabetes  . Hearing loss   . History of stomach ulcers   . Hyperlipidemia   . Hypertension   . Menopause   . Neuromuscular disorder ([place])   . Osteoporosis     Past Surgical History:  Procedure Laterality Date  . COLON SURGERY  2010   colonoscopy every 5 years  . TUBAL LIGATION  1986    There were no vitals filed for this visit.   Subjective Assessment - 02/02/18 1130    Subjective  Pt reports having LBP and L hip pain. 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 She reports having osteoporosis. She states that usually a hot shower and Aspirin used to help but now she is having difficulty driving for long periods (has to stop every 2 hours), difficulty sleeping. Basically any bending or stooping over (sweeping, lifting, etc.) all aggravate her pain. She denies sharp shooting pains or n/t down either leg, and denies any b/b changes. Pt reports that she was told she has compression fractures in her lumbar spine. She's had this pain for years but it has worsened over the last few months. She is  trying PT now to avoid having to get shots and surgery.     Limitations  Sitting;Lifting;Walking;House hold activities    How long can you sit comfortably?  2 hours    How long can you stand comfortably?  10 mins    How long can you walk comfortably?  for a while    Patient Stated Goals  exercises, how to do things properly to help not affect areas of osteoporosis    Currently in Pain?  Yes    Pain Score  8     Pain Location  Back    Pain Orientation  Lower;Left    Pain Descriptors / Indicators  Aching    Pain Type  Chronic pain    Pain Onset  More than a month ago    Pain Frequency  Constant    Aggravating Factors   sitting, bending, heavy yardwork    Pain Relieving Factors  heat, rest    Effect of Pain on Daily Activities  keeps going         [facility] PT Assessment - 02/02/18 0001      Assessment   Medical Diagnosis  chronic LBP    Referring Provider  Newman Pies, MD    Onset Date/Surgical Date  -- chronic, worsened over last few months  Next MD Visit  -- not until after therapy    Prior Therapy  none       Balance Screen   Has the patient fallen in the past 6 months  Yes    How many times?  1 dog pulled her down    Has the patient had a decrease in activity level because of a fear of falling?   No    Is the patient reluctant to leave their home because of a fear of falling?   No      Prior Function   Level of Independence  Independent    Vocation  Retired    Leisure  dog activities      Observation/Other Assessments   Focus on Therapeutic Outcomes (FOTO)   35% limitation      Sensation   Light Touch  Appears Intact      Posture/Postural Control   Posture/Postural Control  Postural limitations    Postural Limitations  Rounded Shoulders;Forward head;Increased thoracic kyphosis;Increased lumbar lordosis      ROM / Strength   AROM / PROM / Strength  AROM;Strength      AROM   AROM Assessment Site  Lumbar    Lumbar Flexion  25% limited, increased lumbar  lordosis; RFIS x10: no change    Lumbar Extension  WFL; REIS x10: no change     Lumbar - Right Side Bend  knee joint line, recreated same pain on L lower back    Lumbar - Left Side Bend  knee joint line, recreated same pain on R side    Lumbar - Right Rotation  25% limited, no pain    Lumbar - Left Rotation  25% limited, no pain      Strength   Strength Assessment Site  Hip;Knee;Ankle    Right Hip Flexion  4+/5    Right Hip Extension  3+/5    Right Hip ABduction  4/5    Left Hip Flexion  4/5    Left Hip Extension  3+/5    Left Hip ABduction  4/5    Right Knee Flexion  4/5    Right Knee Extension  4+/5    Left Knee Flexion  4/5    Left Knee Extension  4+/5    Right Ankle Dorsiflexion  5/5    Left Ankle Dorsiflexion  4+/5      Flexibility   Soft Tissue Assessment /Muscle Length  yes    Hamstrings  WFL    Quadriceps  WNL    Piriformis  tight BLE, L>R    Quadratus Lumborum  increased tightness BLE via palpation, L>R      Palpation   Spinal mobility  not assessed due to h/o osteoporosis    Palpation comment  increased soft tissue restrictions,tenderness to palpation, and recreation of pt's same pain to lumbar paraspinals and gluteals      Special Tests    Special Tests  Lumbar    Lumbar Tests  Straight Leg Raise      Straight Leg Raise   Findings  Negative    Comment  BLE      Ambulation/Gait   Ambulation Distance (Feet)  640 Feet 3MWT    Assistive device  None    Gait Pattern  Step-through pattern;Trendelenburg    Gait Comments  slight increase in L hip pain during 3MWT      Balance   Balance Assessed  Yes      Static Standing Balance   Static Standing -  Balance Support  No upper extremity supported    Static Standing Balance -  Activities   Single Leg Stance - Right Leg;Single Leg Stance - Left Leg    Static Standing - Comment/# of Minutes  R: 7 sec or < L: 6.4 sec or <      Standardized Balance Assessment   Standardized Balance Assessment  Five Times Sit to Stand     Five times sit to stand comments   15.5sec, chair, no UE; bil knee valgus indicating hip weakness           Objective measurements completed on examination: See above findings.       PT Education - 02/02/18 1210    Education provided  Yes    Education Details  exam findings, POC, HEP    Person(s) Educated  Patient    Methods  Explanation;Handout    Comprehension  Verbalized understanding       PT Short Term Goals - 02/02/18 1400      PT SHORT TERM GOAL #1   Title  Pt will be independent with HEP and perform consistently in order to decrease pain and maximize overall function.     Time  2    Period  Weeks    Status  New    Target Date  02/16/18      PT SHORT TERM GOAL #2   Title  Pt will be able to perform bil SLS for 10 sec without UE support to demo improved balance, functional strength in order to maximize gait.    Time  2    Period  Weeks    Status  New      PT SHORT TERM GOAL #3   Title  Pt will be able to perform 5xSTS in 11sec or < with no UE to demo improved balance and overall functional strength.    Time  2    Period  Weeks    Status  New      PT SHORT TERM GOAL #4   Title  Pt will have improved 3MWT by 142ft or > and without increases in LBP or L hip pain and min to no evidence of Trendenberg sign to demo improved functional strength and maximize pt's ability to walk her dogs with less pain.    Time  2    Period  Weeks    Status  New        PT Long Term Goals - 02/02/18 1401      PT LONG TERM GOAL #1   Title  Pt will have improved MMT by 1 grade in all muscle groups tested in order to decrease pain, maximize gait, and maximize her overall function at home.    Time  4    Period  Weeks    Status  New    Target Date  03/02/18      PT LONG TERM GOAL #2   Title  Pt will report being able to sleep through the night without awakening due to pain at least 6 nights/week in order to maximize overall recovery.    Time  4    Period  Weeks    Status   New      PT LONG TERM GOAL #3   Title  Pt will report being able to drive for at least 3 hour or > before needing to stop due to back pain to demonstrate improved sitting tolerance and maximize her ability to drive long distances.  Time  4    Period  Weeks    Status  New      PT LONG TERM GOAL #4   Title  Pt will report being able to walk her dogs for their normal walking regimen (3 miles, 2x/day) with 3/10 LBP and L hip pain or < to demo improved tolerance to ambulation and overall function at home.    Time  4    Period  Weeks    Status  New      PT LONG TERM GOAL #5   Title  Pt will report being able to perform moderate-heavy yardwork with 4/10 LBP and L hip pain or < in order to demo improved overall functional strength and maximize her function at home.     Time  4    Period  Weeks    Status  New             Plan - 02/02/18 1219    Clinical Impression Statement  Pt is pleasant 67YO F who presents to OPPT with c/o chronic LBP, L side worse than R. She also c/o L hip pain worse than R. She currently presents with deficits in MMT, especially proximal hip musculature, functional strength, core strength, balance, posture, and increased soft tissue restrictions of bil lumbar paraspinals, glutes (especially glute med), and piriformis, L>R throughout. She reported recreation of her same pain with palpation to this musculature. She demo'd increased knee valgus during STS and Trendelenberg sign during gait, further indicating gluteal/hip dysfunction and weakness. Pt reports that she has osteoporosis and compression fractures, therefore, spinal mobility not assessed. Pt needs skilled PT interventions to address impairments identified in order to decrease pain and improve overall function at home and in the community.     History and Personal Factors relevant to plan of care:  h/o: osteoporosis, compression fractures lumbar spine, arthritis, DM, HTN    Clinical Presentation  Stable     Clinical Presentation due to:  MMT, SLS, 3MWT, functional strength, gait, pain, soft tissue restrictions    Clinical Decision Making  Low    Rehab Potential  Good    PT Frequency  2x / week    PT Duration  4 weeks    PT Treatment/Interventions  ADLs/Self Care Home Management;Cryotherapy;Electrical Stimulation;Moist Heat;Traction;Ultrasound;Gait training;Stair training;Functional mobility training;Therapeutic activities;Therapeutic exercise;Balance training;Neuromuscular re-education;Patient/family education;Manual techniques;Passive range of motion;Dry needling;Energy conservation;Taping    PT Next Visit Plan  review goals and HEP; initiate BLE, core, functional, postural strengthening and education; initiate manual STM for soft tissue restrictions in lumbar spine and glutes; avoid excessive lumbar flexion due to pt's report of compression fractures and osteoporosis    PT Home Exercise Plan  eval: SKTC, bridging    Consulted and Agree with Plan of Care  Patient       Patient will benefit from skilled therapeutic intervention in order to improve the following deficits and impairments:  Decreased balance, Decreased range of motion, Decreased strength, Difficulty walking, Hypomobility, Increased fascial restricitons, Increased muscle spasms, Improper body mechanics, Postural dysfunction, Pain  Visit Diagnosis: Chronic bilateral low back pain without sciatica  Muscle weakness (generalized)  Other symptoms and signs involving the musculoskeletal system     Problem List There are no active problems to display for this patient.     Geraldine Solar PT, [place] [address] [place], [place], [address] Phone: [phone]   Fax:  [phone]  Name: Kelsey Cook MRN: [id number] Date of  Birth: 03-Apr-1951

## 2018-02-02 NOTE — Patient Instructions (Signed)
Access Code: [email or web address]  URL: [email or web address]  Date: 02/02/2018  Prepared by: Geraldine Solar   Exercises  Single Knee to Chest Stretch - 3-5 reps - 30-60 hold - 1x daily - 7x weekly  Supine Bridge - 10 reps - 3 sets - 1x daily - 7x weekly

## 2018-02-08 ENCOUNTER — Telehealth (HOSPITAL_COMMUNITY): Payer: Self-pay

## 2018-02-08 ENCOUNTER — Encounter (HOSPITAL_COMMUNITY): Payer: PPO

## 2018-02-08 NOTE — Telephone Encounter (Signed)
02/08/18  pt left a message to cx - no reason given and will be here at her next appt.

## 2018-02-10 ENCOUNTER — Telehealth (HOSPITAL_COMMUNITY): Payer: Self-pay

## 2018-02-10 ENCOUNTER — Ambulatory Visit (HOSPITAL_COMMUNITY): Payer: PPO

## 2018-02-10 NOTE — Telephone Encounter (Signed)
Called pt to offer earlier apt.s available today.  Pt stated she is having GI issues today and wishes to cancel apt for today.  Reviewed next apt date and time, pt stated she will try to make it.    [address] [place]; [facility] [phone]

## 2018-02-15 ENCOUNTER — Ambulatory Visit (HOSPITAL_COMMUNITY): Payer: PPO | Admitting: Physical Therapy

## 2018-02-15 DIAGNOSIS — M6281 Muscle weakness (generalized): Secondary | ICD-10-CM

## 2018-02-15 DIAGNOSIS — R29898 Other symptoms and signs involving the musculoskeletal system: Secondary | ICD-10-CM

## 2018-02-15 DIAGNOSIS — M545 Low back pain, unspecified: Secondary | ICD-10-CM

## 2018-02-15 DIAGNOSIS — G8929 Other chronic pain: Secondary | ICD-10-CM

## 2018-02-15 NOTE — Therapy (Signed)
[place], [place], [address] Phone: [phone]   Fax:  [phone]  Physical Therapy Treatment  Patient Details  Name: Kelsey Cook MRN: [id number] Date of Birth: Jun 26, 1951 Referring Provider: Newman Pies, MD   Encounter Date: 02/15/2018  PT End of Session - 02/15/18 1559    Visit Number  2    Number of Visits  9    Date for PT Re-Evaluation  03/02/18    Authorization Type  [employer] (no visit limit, no auth required)    Authorization Time Period  02/02/18 to 03/02/18    PT Start Time  1520    PT Stop Time  1600    PT Time Calculation (min)  40 min    Activity Tolerance  Patient tolerated treatment well    Behavior During Therapy  [facility] for tasks assessed/performed       Past Medical History:  Diagnosis Date  . Arthritis   . Diabetes mellitus without complication (HCC)    prediabetes  . Hearing loss   . History of stomach ulcers   . Hyperlipidemia   . Hypertension   . Menopause   . Neuromuscular disorder ([place])   . Osteoporosis     Past Surgical History:  Procedure Laterality Date  . COLON SURGERY  2010   colonoscopy every 5 years  . TUBAL LIGATION  1986    There were no vitals filed for this visit.  Subjective Assessment - 02/15/18 1542    Subjective  PT states she's been having intestinal issues and this is why she has not been at therapy.  states she did mulching and weeding sunday with her husband so her pain is up to 8/10.  STates she does not take any meds for it.                        [facility] PT Treatment/Exercise - 02/15/18 0001      Lumbar Exercises: Stretches   Active Hamstring Stretch  Right;Left;2 reps;20 seconds    Single Knee to Chest Stretch  Right;Left;2 reps;20 seconds      Lumbar Exercises: Seated   Sit to Stand  10 reps;Limitations    Sit to Stand Limitations  no UE's      Lumbar Exercises: Supine   Bridge  10 reps    Straight Leg  Raise  10 reps      Lumbar Exercises: Sidelying   Hip Abduction  Right;Left;10 reps      Lumbar Exercises: Prone   Straight Leg Raise  10 reps             PT Education - 02/15/18 1559    Education provided  Yes    Education Details  reveiwed evaluation including goals and HEP    Person(s) Educated  Patient    Methods  Explanation;Demonstration;Tactile cues;Verbal cues;Handout    Comprehension  Verbalized understanding;Returned demonstration;Verbal cues required;Tactile cues required       PT Short Term Goals - 02/02/18 1400      PT SHORT TERM GOAL #1   Title  Pt will be independent with HEP and perform consistently in order to decrease pain and maximize overall function.     Time  2    Period  Weeks    Status  New    Target Date  02/16/18      PT SHORT TERM GOAL #2   Title  Pt will be able to  perform bil SLS for 10 sec without UE support to demo improved balance, functional strength in order to maximize gait.    Time  2    Period  Weeks    Status  New      PT SHORT TERM GOAL #3   Title  Pt will be able to perform 5xSTS in 11sec or < with no UE to demo improved balance and overall functional strength.    Time  2    Period  Weeks    Status  New      PT SHORT TERM GOAL #4   Title  Pt will have improved 3MWT by 14ft or > and without increases in LBP or L hip pain and min to no evidence of Trendenberg sign to demo improved functional strength and maximize pt's ability to walk her dogs with less pain.    Time  2    Period  Weeks    Status  New        PT Long Term Goals - 02/02/18 1401      PT LONG TERM GOAL #1   Title  Pt will have improved MMT by 1 grade in all muscle groups tested in order to decrease pain, maximize gait, and maximize her overall function at home.    Time  4    Period  Weeks    Status  New    Target Date  03/02/18      PT LONG TERM GOAL #2   Title  Pt will report being able to sleep through the night without awakening due to pain at least  6 nights/week in order to maximize overall recovery.    Time  4    Period  Weeks    Status  New      PT LONG TERM GOAL #3   Title  Pt will report being able to drive for at least 3 hour or > before needing to stop due to back pain to demonstrate improved sitting tolerance and maximize her ability to drive long distances.    Time  4    Period  Weeks    Status  New      PT LONG TERM GOAL #4   Title  Pt will report being able to walk her dogs for their normal walking regimen (3 miles, 2x/day) with 3/10 LBP and L hip pain or < to demo improved tolerance to ambulation and overall function at home.    Time  4    Period  Weeks    Status  New      PT LONG TERM GOAL #5   Title  Pt will report being able to perform moderate-heavy yardwork with 4/10 LBP and L hip pain or < in order to demo improved overall functional strength and maximize her function at home.     Time  4    Period  Weeks    Status  New            Plan - 02/15/18 1602    Clinical Impression Statement  Reveiwed goals and HEP per intitial evaluation.  PT without questions and required cues to recall and complete HEP correctly.   Began LE strengtheingn exercises and educated on importance fo completing tasks in good posture..  PT without issues or pain completing therex.      Rehab Potential  Good    PT Frequency  2x / week    PT Duration  4 weeks  PT Treatment/Interventions  ADLs/Self Care Home Management;Cryotherapy;Electrical Stimulation;Moist Heat;Traction;Ultrasound;Gait training;Stair training;Functional mobility training;Therapeutic activities;Therapeutic exercise;Balance training;Neuromuscular re-education;Patient/family education;Manual techniques;Passive range of motion;Dry needling;Energy conservation;Taping    PT Next Visit Plan  continue to improve BLE, core, functional, and postural strength.  Initiate manual STM for soft tissue restrictions in lumbar spine and glutes if needed.  Avoid excessive lumbar flexion  due to pt's report of compression fractures and osteoporosis.  Next session begin SLS balance tasks.     PT Home Exercise Plan  eval: SKTC, bridging    Consulted and Agree with Plan of Care  Patient       Patient will benefit from skilled therapeutic intervention in order to improve the following deficits and impairments:  Decreased balance, Decreased range of motion, Decreased strength, Difficulty walking, Hypomobility, Increased fascial restricitons, Increased muscle spasms, Improper body mechanics, Postural dysfunction, Pain  Visit Diagnosis: Chronic bilateral low back pain without sciatica  Muscle weakness (generalized)  Other symptoms and signs involving the musculoskeletal system     Problem List There are no active problems to display for this patient.   Teena Irani 02/15/2018, 4:05 PM  [place], [place], [address] Phone: [phone]   Fax:  [phone]  Name: Synda Bagent MRN: [id number] Date of Birth: [birth year]

## 2018-02-16 NOTE — Addendum Note (Signed)
Addended by: Geralyn Corwin on: 02/16/2018 09:27 AM   Modules accepted: Orders

## 2018-02-17 ENCOUNTER — Encounter (HOSPITAL_COMMUNITY): Payer: Self-pay

## 2018-02-17 ENCOUNTER — Ambulatory Visit (HOSPITAL_COMMUNITY): Payer: PPO

## 2018-02-17 DIAGNOSIS — G8929 Other chronic pain: Secondary | ICD-10-CM

## 2018-02-17 DIAGNOSIS — R29898 Other symptoms and signs involving the musculoskeletal system: Secondary | ICD-10-CM

## 2018-02-17 DIAGNOSIS — M6281 Muscle weakness (generalized): Secondary | ICD-10-CM

## 2018-02-17 DIAGNOSIS — M545 Low back pain: Secondary | ICD-10-CM | POA: Diagnosis not present

## 2018-02-17 NOTE — Therapy (Signed)
[place], [place], [address] Phone: [phone]   Fax:  [phone]  Physical Therapy Treatment  Patient Details  Name: Kelsey Cook MRN: [id number] Date of Birth: 1951/02/17 Referring Provider: Newman Pies, MD   Encounter Date: 02/17/2018  PT End of Session - 02/17/18 1506    Visit Number  3    Number of Visits  9    Date for PT Re-Evaluation  03/02/18    Authorization Type  [employer] (no visit limit, no auth required)    Authorization Time Period  02/02/18 to 03/02/18    PT Start Time  1508    PT Stop Time  1554    PT Time Calculation (min)  46 min    Activity Tolerance  Patient tolerated treatment well    Behavior During Therapy  [facility] for tasks assessed/performed       Past Medical History:  Diagnosis Date  . Arthritis   . Diabetes mellitus without complication (HCC)    prediabetes  . Hearing loss   . History of stomach ulcers   . Hyperlipidemia   . Hypertension   . Menopause   . Neuromuscular disorder ([place])   . Osteoporosis     Past Surgical History:  Procedure Laterality Date  . COLON SURGERY  2010   colonoscopy every 5 years  . TUBAL LIGATION  1986    There were no vitals filed for this visit.  Subjective Assessment - 02/17/18 1510    Subjective  Pt states that she's extremely tired becuase she didn't sleep at all last night due to her dogs. She reports compliance with her HEP. She's been feeling lousy over the last few weeks.     Currently in Pain?  Yes    Pain Score  5     Pain Location  Back    Pain Orientation  Left;Lower    Pain Descriptors / Indicators  Aching    Pain Type  Chronic pain    Pain Onset  More than a month ago    Pain Frequency  Constant    Aggravating Factors   sitting, bending, heavy yardwork    Pain Relieving Factors  heat, rest    Effect of Pain on Daily Activities  keeps going           [facility] Adult PT Treatment/Exercise - 02/17/18 0001       Lumbar Exercises: Stretches   Single Knee to Chest Stretch  Right;Left;2 reps;30 seconds    Figure 4 Stretch  2 reps;30 seconds;Supine;With overpressure    Figure 4 Stretch Limitations  BLE      Lumbar Exercises: Standing   Functional Squats  15 reps;Limitations    Functional Squats Limitations  min cues for form    Scapular Retraction  Both;15 reps;Theraband;Limitations    Theraband Level (Scapular Retraction)  Level 3 (Green)    Scapular Retraction Limitations  2 sets    Row  Both;15 reps;Theraband;Limitations    Theraband Level (Row)  Level 3 (Green)    Row Limitations  high rows; 2 sets    Shoulder Extension  Both;15 reps;Theraband;Limitations    Theraband Level (Shoulder Extension)  Level 3 (Green)    Shoulder Extension Limitations  2 sets    Other Standing Lumbar Exercises  Y's on wall with liftoff x15    Other Standing Lumbar Exercises  hip abd RTB x15 each      Lumbar Exercises: Seated   Other Seated Lumbar Exercises  3D thoracic excursions x15 reps each      Lumbar Exercises: Supine   Bent Knee Raise  15 reps;Limitations    Bent Knee Raise Limitations  2 sets +pulldowns with purple band    Bridge  15 reps;3 seconds    Straight Leg Raise  15 reps;Limitations    Straight Leg Raises Limitations  BLE      Manual Therapy   Manual Therapy  Soft tissue mobilization    Manual therapy comments  completed separate rest of treatment    Soft tissue mobilization  STM to L lumbar paraspinals and QL for pain cotnrol           PT Education - 02/17/18 1507    Education provided  Yes    Education Details  exercise technique, updated HEP to include QL stretch    Person(s) Educated  Patient    Methods  Explanation;Demonstration;Handout    Comprehension  Verbalized understanding;Returned demonstration       PT Short Term Goals - 02/02/18 1400      PT SHORT TERM GOAL #1   Title  Pt will be independent with HEP and perform consistently in order to decrease pain and maximize  overall function.     Time  2    Period  Weeks    Status  New    Target Date  02/16/18      PT SHORT TERM GOAL #2   Title  Pt will be able to perform bil SLS for 10 sec without UE support to demo improved balance, functional strength in order to maximize gait.    Time  2    Period  Weeks    Status  New      PT SHORT TERM GOAL #3   Title  Pt will be able to perform 5xSTS in 11sec or < with no UE to demo improved balance and overall functional strength.    Time  2    Period  Weeks    Status  New      PT SHORT TERM GOAL #4   Title  Pt will have improved 3MWT by 142ft or > and without increases in LBP or L hip pain and min to no evidence of Trendenberg sign to demo improved functional strength and maximize pt's ability to walk her dogs with less pain.    Time  2    Period  Weeks    Status  New        PT Long Term Goals - 02/02/18 1401      PT LONG TERM GOAL #1   Title  Pt will have improved MMT by 1 grade in all muscle groups tested in order to decrease pain, maximize gait, and maximize her overall function at home.    Time  4    Period  Weeks    Status  New    Target Date  03/02/18      PT LONG TERM GOAL #2   Title  Pt will report being able to sleep through the night without awakening due to pain at least 6 nights/week in order to maximize overall recovery.    Time  4    Period  Weeks    Status  New      PT LONG TERM GOAL #3   Title  Pt will report being able to drive for at least 3 hour or > before needing to stop due to back pain to demonstrate improved sitting tolerance and maximize  her ability to drive long distances.    Time  4    Period  Weeks    Status  New      PT LONG TERM GOAL #4   Title  Pt will report being able to walk her dogs for their normal walking regimen (3 miles, 2x/day) with 3/10 LBP and L hip pain or < to demo improved tolerance to ambulation and overall function at home.    Time  4    Period  Weeks    Status  New      PT LONG TERM GOAL #5    Title  Pt will report being able to perform moderate-heavy yardwork with 4/10 LBP and L hip pain or < in order to demo improved overall functional strength and maximize her function at home.     Time  4    Period  Weeks    Status  New            Plan - 02/17/18 1554    Clinical Impression Statement  Began with stretching this date to address flexibility deficits and for pain control. Added supine figure 4 stretch for piriformis and pt reporting this felt very tight. Rest of session focused on core and gluteal strengthening as well as addressing soft tissue restrictions. Pt with significant tenderness to L QL with palpation. Added standing QL stretch to HEP. Pt stating she has ribs on the L side that are "overlapped" and she has difficulty lying supine and sidelying because of it; caution with this going forward. Continue as planned, progressing as able.     Rehab Potential  Good    PT Frequency  2x / week    PT Duration  4 weeks    PT Treatment/Interventions  ADLs/Self Care Home Management;Cryotherapy;Electrical Stimulation;Moist Heat;Traction;Ultrasound;Gait training;Stair training;Functional mobility training;Therapeutic activities;Therapeutic exercise;Balance training;Neuromuscular re-education;Patient/family education;Manual techniques;Passive range of motion;Dry needling;Energy conservation;Taping    PT Next Visit Plan  continue to improve BLE, core, functional, and postural strength.  continue manual STM for soft tissue restrictions in lumbar spine and glutes if needed.  Avoid excessive lumbar flexion due to pt's report of compression fractures and osteoporosis.  Next session begin SLS balance tasks.     PT Home Exercise Plan  eval: SKTC, bridging; 4/25: standing QL stretch in doorway    Consulted and Agree with Plan of Care  Patient       Patient will benefit from skilled therapeutic intervention in order to improve the following deficits and impairments:  Decreased balance, Decreased  range of motion, Decreased strength, Difficulty walking, Hypomobility, Increased fascial restricitons, Increased muscle spasms, Improper body mechanics, Postural dysfunction, Pain  Visit Diagnosis: Chronic bilateral low back pain without sciatica  Muscle weakness (generalized)  Other symptoms and signs involving the musculoskeletal system     Problem List There are no active problems to display for this patient.       Geraldine Solar PT, [place] [address] [place], [place], [address] Phone: [phone]   Fax:  [phone]  Name: Anu Stagner MRN: [id number] Date of Birth: 10/30/1950

## 2018-02-22 ENCOUNTER — Telehealth (HOSPITAL_COMMUNITY): Payer: Self-pay

## 2018-02-22 ENCOUNTER — Ambulatory Visit (HOSPITAL_COMMUNITY): Payer: PPO

## 2018-02-22 NOTE — Telephone Encounter (Signed)
No show, called and left message concerning missed apt.  Included next apt date and time with contact info given.  Haralambos Yeatts, LPTA; CBIS [phone]  

## 2018-02-24 ENCOUNTER — Ambulatory Visit (HOSPITAL_COMMUNITY): Payer: PPO

## 2018-02-24 ENCOUNTER — Telehealth (HOSPITAL_COMMUNITY): Payer: Self-pay

## 2018-02-24 NOTE — Telephone Encounter (Signed)
PT called pt re missed appointment and pt stating that she is in [place] picking up her mother-in-law. She states that she asked to cancel these appointments the last time she was here. Today's appointment was her last scheduled appointment; PT asked her to reschedule but she stated that she would have to wait until she got home to do so as she is not sure what her schedule will be like next week.     Geraldine Solar PT, DPT

## 2018-02-28 DIAGNOSIS — Z6826 Body mass index (BMI) 26.0-26.9, adult: Secondary | ICD-10-CM | POA: Diagnosis not present

## 2018-02-28 DIAGNOSIS — M8000XS Age-related osteoporosis with current pathological fracture, unspecified site, sequela: Secondary | ICD-10-CM | POA: Diagnosis not present

## 2018-02-28 DIAGNOSIS — E663 Overweight: Secondary | ICD-10-CM | POA: Diagnosis not present

## 2018-02-28 DIAGNOSIS — M791 Myalgia, unspecified site: Secondary | ICD-10-CM | POA: Diagnosis not present

## 2018-02-28 DIAGNOSIS — M79642 Pain in left hand: Secondary | ICD-10-CM | POA: Diagnosis not present

## 2018-02-28 DIAGNOSIS — M79641 Pain in right hand: Secondary | ICD-10-CM | POA: Diagnosis not present

## 2018-02-28 DIAGNOSIS — M545 Low back pain: Secondary | ICD-10-CM | POA: Diagnosis not present

## 2018-03-26 DIAGNOSIS — H524 Presbyopia: Secondary | ICD-10-CM | POA: Diagnosis not present

## 2018-04-08 ENCOUNTER — Encounter (HOSPITAL_COMMUNITY): Payer: Self-pay

## 2018-04-08 NOTE — Therapy (Signed)
[place], [place], [address] Phone: [phone]   Fax:  [phone]  Patient Details  Name: Kelsey Cook MRN: [id number] Date of Birth: [birth year] Referring Provider:  No ref. provider found  Encounter Date: 04/08/2018   PHYSICAL THERAPY DISCHARGE SUMMARY  Visits from Start of Care: 3  Current functional level related to goals / functional outcomes: See last treatment note   Remaining deficits: See last treatment note   Education / Equipment: See last treatment note  Plan: Patient agrees to discharge.  Patient goals were not met. Patient is being discharged due to not returning since the last visit.  ?????       Geraldine Solar PT, [place] [address] [place], [place], [address] Phone: [phone]   Fax:  [phone]

## 2018-05-04 DIAGNOSIS — I10 Essential (primary) hypertension: Secondary | ICD-10-CM | POA: Diagnosis not present

## 2018-05-04 DIAGNOSIS — E7849 Other hyperlipidemia: Secondary | ICD-10-CM | POA: Diagnosis not present

## 2018-05-04 DIAGNOSIS — M810 Age-related osteoporosis without current pathological fracture: Secondary | ICD-10-CM | POA: Diagnosis not present

## 2018-05-04 DIAGNOSIS — R82998 Other abnormal findings in urine: Secondary | ICD-10-CM | POA: Diagnosis not present

## 2018-05-11 DIAGNOSIS — N390 Urinary tract infection, site not specified: Secondary | ICD-10-CM | POA: Diagnosis not present

## 2018-05-11 DIAGNOSIS — E7849 Other hyperlipidemia: Secondary | ICD-10-CM | POA: Diagnosis not present

## 2018-05-11 DIAGNOSIS — Z0000 Encounter for general adult medical examination without abnormal findings: Secondary | ICD-10-CM | POA: Diagnosis not present

## 2018-05-11 DIAGNOSIS — R748 Abnormal levels of other serum enzymes: Secondary | ICD-10-CM | POA: Diagnosis not present

## 2018-05-11 DIAGNOSIS — Z6827 Body mass index (BMI) 27.0-27.9, adult: Secondary | ICD-10-CM | POA: Diagnosis not present

## 2018-05-11 DIAGNOSIS — M810 Age-related osteoporosis without current pathological fracture: Secondary | ICD-10-CM | POA: Diagnosis not present

## 2018-05-11 DIAGNOSIS — I10 Essential (primary) hypertension: Secondary | ICD-10-CM | POA: Diagnosis not present

## 2018-05-11 DIAGNOSIS — Z8249 Family history of ischemic heart disease and other diseases of the circulatory system: Secondary | ICD-10-CM | POA: Diagnosis not present

## 2018-05-11 DIAGNOSIS — Z1389 Encounter for screening for other disorder: Secondary | ICD-10-CM | POA: Diagnosis not present

## 2018-05-11 DIAGNOSIS — K219 Gastro-esophageal reflux disease without esophagitis: Secondary | ICD-10-CM | POA: Diagnosis not present

## 2018-05-11 DIAGNOSIS — M545 Low back pain: Secondary | ICD-10-CM | POA: Diagnosis not present

## 2018-06-09 DIAGNOSIS — Z124 Encounter for screening for malignant neoplasm of cervix: Secondary | ICD-10-CM | POA: Diagnosis not present

## 2018-06-09 DIAGNOSIS — Z6828 Body mass index (BMI) 28.0-28.9, adult: Secondary | ICD-10-CM | POA: Diagnosis not present

## 2018-06-09 DIAGNOSIS — N870 Mild cervical dysplasia: Secondary | ICD-10-CM | POA: Diagnosis not present

## 2018-06-15 DIAGNOSIS — M150 Primary generalized (osteo)arthritis: Secondary | ICD-10-CM | POA: Diagnosis not present

## 2018-06-15 DIAGNOSIS — M545 Low back pain: Secondary | ICD-10-CM | POA: Diagnosis not present

## 2018-06-15 DIAGNOSIS — M791 Myalgia, unspecified site: Secondary | ICD-10-CM | POA: Diagnosis not present

## 2018-06-15 DIAGNOSIS — M8000XS Age-related osteoporosis with current pathological fracture, unspecified site, sequela: Secondary | ICD-10-CM | POA: Diagnosis not present

## 2018-06-15 DIAGNOSIS — Z6826 Body mass index (BMI) 26.0-26.9, adult: Secondary | ICD-10-CM | POA: Diagnosis not present

## 2018-06-15 DIAGNOSIS — E663 Overweight: Secondary | ICD-10-CM | POA: Diagnosis not present

## 2018-07-07 ENCOUNTER — Encounter: Payer: Self-pay | Admitting: Gastroenterology

## 2018-08-11 ENCOUNTER — Telehealth: Payer: Self-pay

## 2018-08-11 NOTE — Telephone Encounter (Signed)
Pt was a no show rs for 10-21 at 9am

## 2018-08-15 ENCOUNTER — Other Ambulatory Visit: Payer: Self-pay

## 2018-08-15 ENCOUNTER — Encounter: Payer: Self-pay | Admitting: Gastroenterology

## 2018-08-15 ENCOUNTER — Ambulatory Visit (AMBULATORY_SURGERY_CENTER): Payer: Self-pay | Admitting: *Deleted

## 2018-08-15 VITALS — Ht 64.0 in | Wt 169.0 lb

## 2018-08-15 DIAGNOSIS — Z800 Family history of malignant neoplasm of digestive organs: Secondary | ICD-10-CM

## 2018-08-15 NOTE — Progress Notes (Signed)
Patient denies any allergies to egg or soy products. Patient denies complications with anesthesia/sedation.  Patient denies oxygen use at home and denies diet medications. Patient denies information on colonoscopy. 

## 2018-08-24 ENCOUNTER — Encounter: Payer: PPO | Admitting: Gastroenterology

## 2018-08-30 ENCOUNTER — Encounter: Payer: PPO | Admitting: Gastroenterology

## 2018-09-05 DIAGNOSIS — N958 Other specified menopausal and perimenopausal disorders: Secondary | ICD-10-CM | POA: Diagnosis not present

## 2018-09-05 DIAGNOSIS — M8588 Other specified disorders of bone density and structure, other site: Secondary | ICD-10-CM | POA: Diagnosis not present

## 2018-09-14 DIAGNOSIS — J069 Acute upper respiratory infection, unspecified: Secondary | ICD-10-CM | POA: Diagnosis not present

## 2018-09-14 DIAGNOSIS — R05 Cough: Secondary | ICD-10-CM | POA: Diagnosis not present

## 2018-09-14 DIAGNOSIS — I10 Essential (primary) hypertension: Secondary | ICD-10-CM | POA: Diagnosis not present

## 2018-09-14 DIAGNOSIS — Z6827 Body mass index (BMI) 27.0-27.9, adult: Secondary | ICD-10-CM | POA: Diagnosis not present

## 2018-10-13 ENCOUNTER — Encounter: Payer: PPO | Admitting: Gastroenterology

## 2018-12-19 DIAGNOSIS — R319 Hematuria, unspecified: Secondary | ICD-10-CM | POA: Diagnosis not present

## 2018-12-19 DIAGNOSIS — N390 Urinary tract infection, site not specified: Secondary | ICD-10-CM | POA: Diagnosis not present

## 2018-12-19 DIAGNOSIS — R1011 Right upper quadrant pain: Secondary | ICD-10-CM | POA: Diagnosis not present

## 2018-12-19 DIAGNOSIS — K219 Gastro-esophageal reflux disease without esophagitis: Secondary | ICD-10-CM | POA: Diagnosis not present

## 2018-12-19 DIAGNOSIS — R8290 Unspecified abnormal findings in urine: Secondary | ICD-10-CM | POA: Diagnosis not present

## 2018-12-19 DIAGNOSIS — I10 Essential (primary) hypertension: Secondary | ICD-10-CM | POA: Diagnosis not present

## 2018-12-19 DIAGNOSIS — Z6826 Body mass index (BMI) 26.0-26.9, adult: Secondary | ICD-10-CM | POA: Diagnosis not present

## 2018-12-20 ENCOUNTER — Other Ambulatory Visit: Payer: Self-pay | Admitting: Internal Medicine

## 2018-12-20 DIAGNOSIS — R1011 Right upper quadrant pain: Secondary | ICD-10-CM

## 2018-12-21 ENCOUNTER — Ambulatory Visit
Admission: RE | Admit: 2018-12-21 | Discharge: 2018-12-21 | Disposition: A | Discharge status: Home / Self Care | Payer: PPO | Source: Ambulatory Visit | Attending: Internal Medicine | Admitting: Internal Medicine

## 2018-12-21 DIAGNOSIS — K7689 Other specified diseases of liver: Secondary | ICD-10-CM | POA: Diagnosis not present

## 2018-12-21 DIAGNOSIS — R1011 Right upper quadrant pain: Secondary | ICD-10-CM

## 2018-12-26 ENCOUNTER — Encounter: Payer: Self-pay | Admitting: Internal Medicine

## 2019-01-17 ENCOUNTER — Telehealth: Payer: Self-pay

## 2019-01-17 NOTE — Telephone Encounter (Signed)
Called pt to discuss her OV scheduled for 01/18/19. 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 Pt requests to reschedule her appt. OV rescheduled to 02/16/19@11am . Pt states she is not having any pain since she changed her diet.

## 2019-01-18 ENCOUNTER — Ambulatory Visit: Payer: PPO | Admitting: Internal Medicine

## 2019-02-16 ENCOUNTER — Encounter: Payer: Self-pay | Admitting: Internal Medicine

## 2019-02-16 ENCOUNTER — Other Ambulatory Visit: Payer: Self-pay

## 2019-02-16 ENCOUNTER — Ambulatory Visit (INDEPENDENT_AMBULATORY_CARE_PROVIDER_SITE_OTHER): Payer: PPO | Admitting: Internal Medicine

## 2019-02-16 VITALS — BP 112/70 | HR 64 | Ht 65.5 in | Wt 161.0 lb

## 2019-02-16 DIAGNOSIS — K7689 Other specified diseases of liver: Secondary | ICD-10-CM | POA: Diagnosis not present

## 2019-02-16 DIAGNOSIS — R932 Abnormal findings on diagnostic imaging of liver and biliary tract: Secondary | ICD-10-CM

## 2019-02-16 DIAGNOSIS — G8929 Other chronic pain: Secondary | ICD-10-CM | POA: Diagnosis not present

## 2019-02-16 DIAGNOSIS — R1011 Right upper quadrant pain: Secondary | ICD-10-CM | POA: Diagnosis not present

## 2019-02-16 DIAGNOSIS — Z1211 Encounter for screening for malignant neoplasm of colon: Secondary | ICD-10-CM | POA: Diagnosis not present

## 2019-02-16 NOTE — Progress Notes (Signed)
HISTORY OF PRESENT ILLNESS:  Kelsey Cook is a 68 y.o. female, native [place] who is referred by her primary care provider Dr. Ardeth Cook for evaluation of right upper quadrant pain and abnormal imaging of the liver.  The patient is new to this office.  She reports having problems with right upper quadrant pain for approximately 2 weeks in February.  It was located on the right side and not exacerbated by meals or activity.  She tells me that she has a family history of cancer and was concerned.  She underwent extensive evaluation including office evaluation with physical examination (reviewed) laboratories (reviewed) which were unremarkable including normal liver tests and normal CBC with differential.  Hemoglobin 13.8.  Finally, she underwent an abdominal ultrasound of the right upper quadrant.  The gallbladder was normal as was the common bile duct.  The liver revealed 2 cysts the largest measuring 2.9 cm in the right hepatic lobe.  I have reviewed the ultrasound.  These appear to be simple hepatic cysts.  No concerning features reported or follow-up recommended by radiology.  Patient tells me that her pain has resolved and she has had no other issues or recurrence.  She denies any upper GI symptoms.  Good appetite without weight loss.  She tells me that she underwent colonoscopy in [place] about 10 years ago and is due for follow-up screening.  She is interested in scheduling this in the near future.  She denies any lower GI complaints.  No GI bleeding.  REVIEW OF SYSTEMS:  All non-GI ROS negative unless otherwise stated in the HPI except for back pain  Past Medical History:  Diagnosis Date  . Arthritis    lower back, hips  . Diabetes mellitus without complication (HCC)    prediabetes - diet controlled, no meds  . GERD (gastroesophageal reflux disease)   . Hearing loss    left - no hearing aid  . History of stomach ulcers   . Hyperlipidemia   . Hypertension   . Menopause    . Osteoporosis   . SVD (spontaneous vaginal delivery)    x 3    Past Surgical History:  Procedure Laterality Date  . BREAST SURGERY Left    cysts - benign  . COLONOSCOPY  01/2008   [place] - normal with internal hemorrhoids  . Pettibone    Social History [place]  reports that she has quit smoking. Her smoking use included cigarettes. She smoked 0.50 packs per day. She has never used smokeless tobacco. She reports current alcohol use. She reports that she does not use drugs.  family history includes Alcohol abuse in her brother; Arthritis in her maternal grandfather, maternal grandmother, mother, paternal grandfather, paternal grandmother, and sister; Asthma in her sister; COPD in her sister; Cancer in her mother, sister, and sister; Colon cancer in her mother; Depression in her sister, sister, sister, and sister; Diabetes in her son; Early death in her brother, mother, and sister; Heart disease in her father, maternal grandfather, maternal grandmother, mother, paternal grandfather, paternal grandmother, and sister; Hyperlipidemia in her mother, sister, sister, sister, and sister; Hypertension in her mother; Miscarriages / Stillbirths in her mother; Stroke in her mother and sister.  Allergies  Allergen Reactions  . Other     Percodan - tongue swelling  . Percocet [Oxycodone-Acetaminophen] Swelling    tongue  . Hydrocodone     nasuea vomting       PHYSICAL EXAMINATION: Vital signs: BP 112/70   Pulse  64   Ht 5' 5.5" (1.664 m)   Wt 161 lb (73 kg)   LMP  (LMP Unknown)   BMI 26.38 kg/m   Constitutional: , no acute distress Psychiatric: alert and oriented x3, cooperative No  physical exam with telemedicine visit   ASSESSMENT:  1.  Transient right upper quadrant pain several months ago without recurrence.  Nonspecific.  No worrisome features.  Negative work-up including laboratories and imaging 2.  Abnormal ultrasound of the liver revealing benign hepatic cyst.   No further work-up warranted 3.  Colon cancer screening.  Baseline risk.  Due for follow-up this year   PLAN:  1.  Observe for any recurrence of significant abdominal pain.  If so reevaluation 2.  I discussed with her benign hepatic cysts of the liver. 3.  Screening colonoscopy at the patient's nearest convenience would be due this year. The nature of the procedure, as well as the risks, benefits, and alternatives were carefully and thoroughly reviewed with the patient. Ample time for discussion and questions allowed. The patient understood, was satisfied, and agreed to proceed. She states that she will call the office to schedule this, and may see 1 of our previsit nurses.  This telemedicine visit was initiated by the patient and consented for by the patient.  This was telephone only (Medicare patient in coronavirus pandemic).  She was in her home and I was in my office during the encounter.  She understands her may be an associated professional charge.

## 2019-03-27 DIAGNOSIS — L301 Dyshidrosis [pompholyx]: Secondary | ICD-10-CM | POA: Diagnosis not present

## 2019-04-04 DIAGNOSIS — M791 Myalgia, unspecified site: Secondary | ICD-10-CM | POA: Diagnosis not present

## 2019-04-04 DIAGNOSIS — R748 Abnormal levels of other serum enzymes: Secondary | ICD-10-CM | POA: Diagnosis not present

## 2019-04-04 DIAGNOSIS — R252 Cramp and spasm: Secondary | ICD-10-CM | POA: Diagnosis not present

## 2019-04-04 DIAGNOSIS — E785 Hyperlipidemia, unspecified: Secondary | ICD-10-CM | POA: Diagnosis not present

## 2019-04-05 DIAGNOSIS — R252 Cramp and spasm: Secondary | ICD-10-CM | POA: Diagnosis not present

## 2019-04-05 DIAGNOSIS — M791 Myalgia, unspecified site: Secondary | ICD-10-CM | POA: Diagnosis not present

## 2019-05-08 DIAGNOSIS — I10 Essential (primary) hypertension: Secondary | ICD-10-CM | POA: Diagnosis not present

## 2019-05-08 DIAGNOSIS — M810 Age-related osteoporosis without current pathological fracture: Secondary | ICD-10-CM | POA: Diagnosis not present

## 2019-05-08 DIAGNOSIS — E7849 Other hyperlipidemia: Secondary | ICD-10-CM | POA: Diagnosis not present

## 2019-05-15 DIAGNOSIS — R748 Abnormal levels of other serum enzymes: Secondary | ICD-10-CM | POA: Diagnosis not present

## 2019-05-15 DIAGNOSIS — Z0000 Encounter for general adult medical examination without abnormal findings: Secondary | ICD-10-CM | POA: Diagnosis not present

## 2019-05-15 DIAGNOSIS — Z800 Family history of malignant neoplasm of digestive organs: Secondary | ICD-10-CM | POA: Diagnosis not present

## 2019-05-15 DIAGNOSIS — K219 Gastro-esophageal reflux disease without esophagitis: Secondary | ICD-10-CM | POA: Diagnosis not present

## 2019-05-15 DIAGNOSIS — Z1331 Encounter for screening for depression: Secondary | ICD-10-CM | POA: Diagnosis not present

## 2019-05-15 DIAGNOSIS — E785 Hyperlipidemia, unspecified: Secondary | ICD-10-CM | POA: Diagnosis not present

## 2019-05-15 DIAGNOSIS — M791 Myalgia, unspecified site: Secondary | ICD-10-CM | POA: Diagnosis not present

## 2019-05-15 DIAGNOSIS — Z8249 Family history of ischemic heart disease and other diseases of the circulatory system: Secondary | ICD-10-CM | POA: Diagnosis not present

## 2019-05-15 DIAGNOSIS — I10 Essential (primary) hypertension: Secondary | ICD-10-CM | POA: Diagnosis not present

## 2019-05-15 DIAGNOSIS — M810 Age-related osteoporosis without current pathological fracture: Secondary | ICD-10-CM | POA: Diagnosis not present

## 2019-06-05 DIAGNOSIS — M810 Age-related osteoporosis without current pathological fracture: Secondary | ICD-10-CM | POA: Diagnosis not present

## 2019-06-05 DIAGNOSIS — W01198A Fall on same level from slipping, tripping and stumbling with subsequent striking against other object, initial encounter: Secondary | ICD-10-CM | POA: Diagnosis not present

## 2019-06-05 DIAGNOSIS — M545 Low back pain: Secondary | ICD-10-CM | POA: Diagnosis not present

## 2019-06-05 DIAGNOSIS — M79602 Pain in left arm: Secondary | ICD-10-CM | POA: Diagnosis not present

## 2019-06-08 ENCOUNTER — Other Ambulatory Visit: Payer: Self-pay

## 2019-06-08 ENCOUNTER — Ambulatory Visit (AMBULATORY_SURGERY_CENTER): Payer: Self-pay | Admitting: *Deleted

## 2019-06-08 VITALS — Temp 96.2°F | Ht 65.5 in | Wt 168.0 lb

## 2019-06-08 DIAGNOSIS — Z800 Family history of malignant neoplasm of digestive organs: Secondary | ICD-10-CM

## 2019-06-08 MED ORDER — SUPREP BOWEL PREP KIT 17.5-3.13-1.6 GM/177ML PO SOLN: 1.0000 | Freq: Once | ORAL | 0 refills | Status: AC

## 2019-06-08 NOTE — Progress Notes (Signed)
No egg or soy allergy known to patient  No issues with past sedation with any surgeries  or procedures, no intubation problems  No diet pills per patient No home 02 use per patient  No blood thinners per patient  Pt denies issues with constipation  No A fib or A flutter  EMMI video sent to pt's e mail  Suprep Sample- [id number]  Exp 03/22

## 2019-06-13 DIAGNOSIS — M19012 Primary osteoarthritis, left shoulder: Secondary | ICD-10-CM | POA: Diagnosis not present

## 2019-06-13 DIAGNOSIS — M25512 Pain in left shoulder: Secondary | ICD-10-CM | POA: Diagnosis not present

## 2019-06-22 ENCOUNTER — Encounter: Payer: PPO | Admitting: Internal Medicine

## 2019-06-28 ENCOUNTER — Encounter: Payer: Self-pay | Admitting: Internal Medicine

## 2019-07-07 ENCOUNTER — Encounter: Payer: PPO | Admitting: Internal Medicine

## 2019-07-12 ENCOUNTER — Encounter: Payer: PPO | Admitting: Internal Medicine

## 2019-07-14 ENCOUNTER — Other Ambulatory Visit: Payer: Self-pay | Admitting: Internal Medicine

## 2019-07-14 DIAGNOSIS — Z77118 Contact with and (suspected) exposure to other environmental pollution: Secondary | ICD-10-CM

## 2019-07-14 DIAGNOSIS — R05 Cough: Secondary | ICD-10-CM | POA: Diagnosis not present

## 2019-07-14 DIAGNOSIS — Z87891 Personal history of nicotine dependence: Secondary | ICD-10-CM | POA: Diagnosis not present

## 2019-07-14 DIAGNOSIS — R0602 Shortness of breath: Secondary | ICD-10-CM

## 2019-07-14 DIAGNOSIS — R1011 Right upper quadrant pain: Secondary | ICD-10-CM | POA: Diagnosis not present

## 2019-07-14 DIAGNOSIS — K7689 Other specified diseases of liver: Secondary | ICD-10-CM | POA: Diagnosis not present

## 2019-07-14 DIAGNOSIS — R059 Cough, unspecified: Secondary | ICD-10-CM

## 2019-07-19 ENCOUNTER — Other Ambulatory Visit: Payer: Self-pay | Admitting: Internal Medicine

## 2019-07-19 DIAGNOSIS — Z87891 Personal history of nicotine dependence: Secondary | ICD-10-CM

## 2019-07-19 DIAGNOSIS — Z77118 Contact with and (suspected) exposure to other environmental pollution: Secondary | ICD-10-CM

## 2019-07-19 DIAGNOSIS — R0602 Shortness of breath: Secondary | ICD-10-CM

## 2019-07-19 DIAGNOSIS — R05 Cough: Secondary | ICD-10-CM

## 2019-07-19 DIAGNOSIS — R059 Cough, unspecified: Secondary | ICD-10-CM

## 2019-07-20 ENCOUNTER — Other Ambulatory Visit: Payer: Self-pay | Admitting: Internal Medicine

## 2019-07-20 DIAGNOSIS — Z87891 Personal history of nicotine dependence: Secondary | ICD-10-CM

## 2019-07-20 DIAGNOSIS — R05 Cough: Secondary | ICD-10-CM

## 2019-07-20 DIAGNOSIS — K7689 Other specified diseases of liver: Secondary | ICD-10-CM

## 2019-07-20 DIAGNOSIS — R1011 Right upper quadrant pain: Secondary | ICD-10-CM

## 2019-07-20 DIAGNOSIS — R059 Cough, unspecified: Secondary | ICD-10-CM

## 2019-07-20 DIAGNOSIS — R0602 Shortness of breath: Secondary | ICD-10-CM

## 2019-07-20 DIAGNOSIS — Z77118 Contact with and (suspected) exposure to other environmental pollution: Secondary | ICD-10-CM

## 2019-07-24 ENCOUNTER — Other Ambulatory Visit: Payer: PPO

## 2019-07-27 ENCOUNTER — Telehealth: Payer: Self-pay

## 2019-07-27 DIAGNOSIS — Z1231 Encounter for screening mammogram for malignant neoplasm of breast: Secondary | ICD-10-CM | POA: Diagnosis not present

## 2019-07-27 DIAGNOSIS — Z124 Encounter for screening for malignant neoplasm of cervix: Secondary | ICD-10-CM | POA: Diagnosis not present

## 2019-07-27 DIAGNOSIS — Z01419 Encounter for gynecological examination (general) (routine) without abnormal findings: Secondary | ICD-10-CM | POA: Diagnosis not present

## 2019-07-27 DIAGNOSIS — Z6829 Body mass index (BMI) 29.0-29.9, adult: Secondary | ICD-10-CM | POA: Diagnosis not present

## 2019-07-27 NOTE — Telephone Encounter (Signed)
Covid-19 screening questions   Do you now or have you had a fever in the last 14 days?  Do you have any respiratory symptoms of shortness of breath or cough now or in the last 14 days?  Do you have any family members or close contacts with diagnosed or suspected Covid-19 in the past 14 days?  Have you been tested for Covid-19 and found to be positive?       

## 2019-07-28 ENCOUNTER — Other Ambulatory Visit: Payer: Self-pay

## 2019-07-28 ENCOUNTER — Encounter: Payer: Self-pay | Admitting: Internal Medicine

## 2019-07-28 ENCOUNTER — Ambulatory Visit (AMBULATORY_SURGERY_CENTER): Payer: PPO | Admitting: Internal Medicine

## 2019-07-28 VITALS — BP 124/64 | HR 52 | Temp 98.2°F | Resp 15 | Ht 65.5 in | Wt 168.0 lb

## 2019-07-28 DIAGNOSIS — D122 Benign neoplasm of ascending colon: Secondary | ICD-10-CM

## 2019-07-28 DIAGNOSIS — K635 Polyp of colon: Secondary | ICD-10-CM

## 2019-07-28 DIAGNOSIS — Z800 Family history of malignant neoplasm of digestive organs: Secondary | ICD-10-CM

## 2019-07-28 DIAGNOSIS — Z1211 Encounter for screening for malignant neoplasm of colon: Secondary | ICD-10-CM | POA: Diagnosis not present

## 2019-07-28 DIAGNOSIS — Z86010 Personal history of colonic polyps: Secondary | ICD-10-CM

## 2019-07-28 MED ORDER — SODIUM CHLORIDE 0.9 % IV SOLN: 500.0000 mL | Freq: Once | INTRAVENOUS | Status: DC

## 2019-07-28 NOTE — Progress Notes (Signed)
PT taken to PACU. Monitors in place. VSS. Report given to RN. 

## 2019-07-28 NOTE — Op Note (Signed)
[facility] Patient Name: Kelsey Cook Procedure Date: 07/28/2019 2:52 PM MRN: [id number] Endoscopist: Docia Chuck. Henrene Pastor , MD Age: 68 Referring MD:  Date of Birth: August 02, 1951 Gender: Female Account #: [id number] Procedure:                Colonoscopy with cold snare polypectomy x 1 Indications:              Screening in patient at increased risk: Family                            history of 1st-degree relative with colorectal                            cancer. Reports prior colonoscopy 11 years ago with                            "polyps"?"elsewhere. No details Medicines:                Monitored Anesthesia Care Procedure:                Pre-Anesthesia Assessment:                           - Prior to the procedure, a History and Physical                            was performed, and patient medications and                            allergies were reviewed. The patient's tolerance of                            previous anesthesia was also reviewed. The risks                            and benefits of the procedure and the sedation                            options and risks were discussed with the patient.                            All questions were answered, and informed consent                            was obtained. Prior Anticoagulants: The patient has                            taken no previous anticoagulant or antiplatelet                            agents. ASA Grade Assessment: II - A patient with                            mild systemic disease. After reviewing the risks  and benefits, the patient was deemed in                            satisfactory condition to undergo the procedure.                           After obtaining informed consent, the colonoscope                            was passed under direct vision. Throughout the                            procedure, the patient's blood pressure, pulse, and   oxygen saturations were monitored continuously. The                            Colonoscope was introduced through the anus and                            advanced to the the cecum, identified by                            appendiceal orifice and ileocecal valve. The                            ileocecal valve, appendiceal orifice, and rectum                            were photographed. The quality of the bowel                            preparation was excellent. The colonoscopy was                            performed without difficulty. The patient tolerated                            the procedure well. The bowel preparation used was                            SUPREP via split dose instruction. Scope In: 2:54:15 PM Scope Out: 3:04:48 PM Scope Withdrawal Time: 0 hours 7 minutes 20 seconds  Total Procedure Duration: 0 hours 10 minutes 33 seconds  Findings:                 A 3 mm polyp was found in the ascending colon. The                            polyp was removed with a cold snare. Resection and                            retrieval were complete.                           The exam was  otherwise without abnormality on                            direct and retroflexion views. Complications:            No immediate complications. Estimated blood loss:                            None. Estimated Blood Loss:     Estimated blood loss: none. Impression:               - One 3 mm polyp in the ascending colon, removed                            with a cold snare. Resected and retrieved.                           - The examination was otherwise normal on direct                            and retroflexion views. Recommendation:           - Repeat colonoscopy in 5-10 years for surveillance.                           - Patient has a contact number available for                            emergencies. The signs and symptoms of potential                            delayed complications were discussed  with the                            patient. Return to normal activities tomorrow.                            Written discharge instructions were provided to the                            patient.                           - Resume previous diet.                           - Continue present medications.                           - Await pathology results. Docia Chuck. Henrene Pastor, MD 07/28/2019 3:09:16 PM This report has been signed electronically.

## 2019-07-28 NOTE — Progress Notes (Signed)
Called to room to assist during endoscopic procedure.  Patient ID and intended procedure confirmed with present staff. Received instructions for my participation in the procedure from the performing physician.  

## 2019-07-28 NOTE — Patient Instructions (Signed)
Handouts Provided:  Polyps  YOU HAD AN ENDOSCOPIC PROCEDURE TODAY AT THE [facility]:   Refer to the procedure report that was given to you for any specific questions about what was found during the examination.  If the procedure report does not answer your questions, please call your gastroenterologist to clarify.  If you requested that your care partner not be given the details of your procedure findings, then the procedure report has been included in a sealed envelope for you to review at your convenience later.  YOU SHOULD EXPECT: Some feelings of bloating in the abdomen. Passage of more gas than usual.  Walking can help get rid of the air that was put into your GI tract during the procedure and reduce the bloating. If you had a lower endoscopy (such as a colonoscopy or flexible sigmoidoscopy) you may notice spotting of blood in your stool or on the toilet paper. If you underwent a bowel prep for your procedure, you may not have a normal bowel movement for a few days.  Please Note:  You might notice some irritation and congestion in your nose or some drainage.  This is from the oxygen used during your procedure.  There is no need for concern and it should clear up in a day or so.  SYMPTOMS TO REPORT IMMEDIATELY:   Following lower endoscopy (colonoscopy or flexible sigmoidoscopy):  Excessive amounts of blood in the stool  Significant tenderness or worsening of abdominal pains  Swelling of the abdomen that is new, acute  Fever of 100F or higher  For urgent or emergent issues, a gastroenterologist can be reached at any hour by calling [phone].   DIET:  We do recommend a small meal at first, but then you may proceed to your regular diet.  Drink plenty of fluids but you should avoid alcoholic beverages for 24 hours.  ACTIVITY:  You should plan to take it easy for the rest of today and you should NOT DRIVE or use heavy machinery until tomorrow (because of the sedation  medicines used during the test).    FOLLOW UP: Our staff will call the number listed on your records 48-72 hours following your procedure to check on you and address any questions or concerns that you may have regarding the information given to you following your procedure. If we do not reach you, we will leave a message.  We will attempt to reach you two times.  During this call, we will ask if you have developed any symptoms of COVID 19. If you develop any symptoms (ie: fever, flu-like symptoms, shortness of breath, cough etc.) before then, please call [phone].  If you test positive for Covid 19 in the 2 weeks post procedure, please call and report this information to us.    If any biopsies were taken you will be contacted by phone or by letter within the next 1-3 weeks.  Please call us at [phone] if you have not heard about the biopsies in 3 weeks.    SIGNATURES/CONFIDENTIALITY: You and/or your care partner have signed paperwork which will be entered into your electronic medical record.  These signatures attest to the fact that that the information above on your After Visit Summary has been reviewed and is understood.  Full responsibility of the confidentiality of this discharge information lies with you and/or your care-partner.  

## 2019-07-28 NOTE — Progress Notes (Addendum)
History reviewed today  Temp [facility] VS [place]

## 2019-07-31 ENCOUNTER — Other Ambulatory Visit: Payer: Self-pay

## 2019-07-31 ENCOUNTER — Ambulatory Visit
Admission: RE | Admit: 2019-07-31 | Discharge: 2019-07-31 | Disposition: A | Discharge status: Home / Self Care | Payer: PPO | Source: Ambulatory Visit | Attending: Internal Medicine | Admitting: Internal Medicine

## 2019-07-31 DIAGNOSIS — K838 Other specified diseases of biliary tract: Secondary | ICD-10-CM | POA: Diagnosis not present

## 2019-07-31 DIAGNOSIS — R05 Cough: Secondary | ICD-10-CM

## 2019-07-31 DIAGNOSIS — Z77118 Contact with and (suspected) exposure to other environmental pollution: Secondary | ICD-10-CM

## 2019-07-31 DIAGNOSIS — K7689 Other specified diseases of liver: Secondary | ICD-10-CM | POA: Diagnosis not present

## 2019-07-31 DIAGNOSIS — R059 Cough, unspecified: Secondary | ICD-10-CM

## 2019-07-31 DIAGNOSIS — R0602 Shortness of breath: Secondary | ICD-10-CM

## 2019-07-31 DIAGNOSIS — Z87891 Personal history of nicotine dependence: Secondary | ICD-10-CM

## 2019-07-31 DIAGNOSIS — R1011 Right upper quadrant pain: Secondary | ICD-10-CM

## 2019-07-31 DIAGNOSIS — R911 Solitary pulmonary nodule: Secondary | ICD-10-CM | POA: Diagnosis not present

## 2019-07-31 MED ORDER — IOPAMIDOL (ISOVUE-300) INJECTION 61%
100.0000 mL | Freq: Once | INTRAVENOUS | Status: AC | PRN
  Administered 2019-07-31: 100 mL via INTRAVENOUS

## 2019-08-01 ENCOUNTER — Telehealth: Payer: Self-pay | Admitting: *Deleted

## 2019-08-01 NOTE — Telephone Encounter (Signed)
1. Have you developed a fever since your procedure? no  2.   Have you had an respiratory symptoms (SOB or cough) since your procedure? no  3.   Have you tested positive for COVID 19 since your procedure no  4.   Have you had any family members/close contacts diagnosed with the COVID 19 since your procedure? no   If yes to any of these questions please route to Joylene John, RN and Alphonsa Gin, Therapist, sports.  Follow up Call-  Call back number 07/28/2019  Post procedure Call Back phone  # [phone]  Permission to leave phone message Yes  Some recent data might be hidden     Patient questions:  Do you have a fever, pain , or abdominal swelling? No. Pain Score  0 *  Have you tolerated food without any problems? Yes.    Have you been able to return to your normal activities? Yes.    Do you have any questions about your discharge instructions: Diet   No. Medications  No. Follow up visit  No.  Do you have questions or concerns about your Care? No.  Actions: * If pain score is 4 or above: No action needed, pain <4.

## 2019-08-04 ENCOUNTER — Encounter: Payer: Self-pay | Admitting: Internal Medicine

## 2019-08-10 DIAGNOSIS — R1011 Right upper quadrant pain: Secondary | ICD-10-CM | POA: Diagnosis not present

## 2019-09-27 DIAGNOSIS — R87619 Unspecified abnormal cytological findings in specimens from cervix uteri: Secondary | ICD-10-CM | POA: Diagnosis not present

## 2019-09-27 DIAGNOSIS — R309 Painful micturition, unspecified: Secondary | ICD-10-CM | POA: Diagnosis not present

## 2019-09-28 DIAGNOSIS — N870 Mild cervical dysplasia: Secondary | ICD-10-CM | POA: Diagnosis not present

## 2019-10-06 ENCOUNTER — Ambulatory Visit: Payer: PPO | Admitting: Cardiology

## 2019-10-06 ENCOUNTER — Encounter: Payer: Self-pay | Admitting: Cardiology

## 2019-10-06 ENCOUNTER — Other Ambulatory Visit: Payer: Self-pay

## 2019-10-06 VITALS — BP 126/80 | HR 54 | Ht 64.0 in | Wt 166.0 lb

## 2019-10-06 DIAGNOSIS — R072 Precordial pain: Secondary | ICD-10-CM

## 2019-10-06 DIAGNOSIS — R0789 Other chest pain: Secondary | ICD-10-CM | POA: Diagnosis not present

## 2019-10-06 DIAGNOSIS — M791 Myalgia, unspecified site: Secondary | ICD-10-CM | POA: Diagnosis not present

## 2019-10-06 DIAGNOSIS — Z8249 Family history of ischemic heart disease and other diseases of the circulatory system: Secondary | ICD-10-CM | POA: Diagnosis not present

## 2019-10-06 DIAGNOSIS — R61 Generalized hyperhidrosis: Secondary | ICD-10-CM | POA: Diagnosis not present

## 2019-10-06 DIAGNOSIS — R0609 Other forms of dyspnea: Secondary | ICD-10-CM | POA: Diagnosis not present

## 2019-10-06 NOTE — Progress Notes (Signed)
Patient referred by Velna Hatchet, MD for shortness of breath, back pain, family h/o CAD.  Subjective:   Kelsey Cook, female    DOB: 1951/03/09, 68 y.o.   MRN: [id number]  Chief Complaint  Patient presents with  . Shortness of Breath  . Hypertension  . New Patient (Initial Visit)     HPI  68 y.o. Caucasian female with hypertension, hyperlipidemia, family history of early CAD, referred for evaluation of shortness of breath, diaphoresis.  Patient is originally from [place], moved to [place] in 2015.  She is above risk your.  She walks 16 miles daily with her dogs at a leisurely pace.  Recently, she has experienced shortness of breath on walking.  She has also noticed pain in her back on walking, that is relieved with rest.  In addition, she has had episodes of profuse sweating for no particular reason, according to her.  Patient has significant family history of early coronary artery disease.  Several family members on maternal side have had MI in 18s and 83s.  Underwent coronary angiography 10 years ago, did not have any intervention, but simvastatin was changed to atorvastatin.  While on atorvastatin, she has had myalgias with elevated creatinine kinase of 585 noted in June 2020.  She stopped atorvastatin a week ago.     Past Medical History:  Diagnosis Date  . Allergy   . Arthritis    lower back, hips  . Diabetes mellitus without complication (HCC)    prediabetes - diet controlled, no meds  . GERD (gastroesophageal reflux disease)   . Hearing loss    left - no hearing aid  . History of stomach ulcers   . Hyperlipidemia   . Hypertension   . Menopause   . Osteoporosis   . SVD (spontaneous vaginal delivery)    x 3     Past Surgical History:  Procedure Laterality Date  . BREAST SURGERY Left    cysts - benign  . CARDIAC CATHETERIZATION  2010  . COLONOSCOPY  01/2008   [place] - normal with internal hemorrhoids  . TUBAL LIGATION  1986     Social History    Tobacco Use  Smoking Status Former Smoker  . Packs/day: 0.50  . Types: Cigarettes  Smokeless Tobacco Never Used  Tobacco Comment   Quit 2009    Social History   Substance and Sexual Activity  Alcohol Use Yes   Comment: occasional wine     Family History  Problem Relation Age of Onset  . Arthritis Mother   . Cancer Mother   . Early death Mother   . Hyperlipidemia Mother   . Hypertension Mother   . Miscarriages / [place] Mother   . Stroke Mother   . Colon cancer Mother 85  . Heart attack Mother 85  . Heart disease Father   . Arthritis Sister   . Depression Sister   . Early death Sister   . Heart disease Sister 40  . Hyperlipidemia Sister   . Liver cancer Sister   . Alcohol abuse Brother   . Early death Brother   . Diabetes Son   . Arthritis Maternal Grandmother   . Heart disease Maternal Grandmother   . Arthritis Maternal Grandfather   . Heart disease Maternal Grandfather   . Arthritis Paternal Grandmother   . Heart disease Paternal Grandmother   . Arthritis Paternal Grandfather   . Heart disease Paternal Grandfather   . Asthma Sister   . Cancer Sister   .  Depression Sister   . Hyperlipidemia Sister   . Stroke Sister   . Breast cancer Sister   . Cancer Sister   . Depression Sister   . Hyperlipidemia Sister   . Uterine cancer Sister   . COPD Sister   . Depression Sister   . Hyperlipidemia Sister   . Heart attack Maternal Aunt 55  . Stomach cancer Neg Hx   . Rectal cancer Neg Hx   . Colon polyps Neg Hx   . Esophageal cancer Neg Hx      Current Outpatient Medications on File Prior to Visit  Medication Sig Dispense Refill  . alendronate (FOSAMAX) 5 MG tablet Take 70 mg by mouth once a week. Take with a full glass of water on an empty stomach.     Marland Kitchen aspirin EC 81 MG tablet Take 81 mg by mouth daily.    Marland Kitchen glucose blood test strip TEST BLOOD SUGAR TWICE DAILY    . lisinopril (PRINIVIL,ZESTRIL) 10 MG tablet Take 10 mg by mouth daily.    . Multiple  Vitamin (MULTIVITAMIN) capsule Take 1 capsule by mouth daily. Centrum Silver for WOmen    . nitrofurantoin, macrocrystal-monohydrate, (MACROBID) 100 MG capsule Take 100 mg by mouth daily.    Marland Kitchen omeprazole (PRILOSEC) 40 MG capsule Take 40 mg by mouth daily.    . traMADol (ULTRAM) 50 MG tablet Take by mouth every 6 (six) hours as needed.    . meloxicam (MOBIC) 15 MG tablet TAKE ONE (1) TABLET BY MOUTH EVERY DAY WITH FOOD     No current facility-administered medications on file prior to visit.    Cardiovascular and other pertinent studies:  EKG 10/06/2019: Sinus bradycardia 48 bpm. Otherwise normal EKG.  CT Chest 07/31/2019: 1. Mild extrahepatic biliary dilatation with the common bile duct at 0.8 cm. The gallbladder appears unremarkable. 2. 2 hepatic cysts are present, and there is a third sub-centimeter hepatic hypodense lesion which is technically too small to characterize, although statistically likely to be benign. 3. 4 mm right lower lobe pulmonary nodule and 2 mm lingular nodule. No follow-up needed if patient is low-risk (and has no known or suspected primary neoplasm). Non-contrast chest CT can be considered in 12 months if patient is high-risk. This recommendation follows the consensus statement: Guidelines for Management of Incidental Pulmonary Nodules Detected on CT Images: From the Fleischner Society 2017; Radiology 2017; 284:228-243. 4. Other imaging findings of potential clinical significance: Aortic Atherosclerosis (ICD10-I70.0). Mild cardiomegaly. Mild left foraminal impingement at L5-S1.   Recent labs: 05/08/2019: Glucose 99, BUN/Cr 25/0.8. EGFR 71. Na/K 138/4.5. Rest of the CMP normal H/H 13/42. MCV 94. Platelets 250 Chol 210, TG 60, HDL 64, LDL 134 Apolipoprotein B 95 (49-90) TSH normal  04/05/2019: CK 585 (32-182)    Review of Systems  Constitution: Positive for diaphoresis. Negative for decreased appetite, malaise/fatigue, weight gain and weight loss.   HENT: Negative for congestion.   Eyes: Negative for visual disturbance.  Cardiovascular: Positive for dyspnea on exertion. Negative for chest pain, leg swelling, palpitations and syncope.       Back pain on exertion  Respiratory: Negative for cough.   Endocrine: Negative for cold intolerance.  Hematologic/Lymphatic: Does not bruise/bleed easily.  Skin: Negative for itching and rash.  Musculoskeletal: Negative for myalgias.  Gastrointestinal: Negative for abdominal pain, nausea and vomiting.  Genitourinary: Negative for dysuria.  Neurological: Negative for dizziness and weakness.  Psychiatric/Behavioral: The patient is not nervous/anxious.   All other systems reviewed and are negative.  Vitals:   10/06/19 1244  BP: 126/80  Pulse: (!) 54  SpO2: 98%     Body mass index is 28.49 kg/m. Filed Weights   10/06/19 1244  Weight: 166 lb (75.3 kg)     Objective:   Physical Exam  Constitutional: She is oriented to person, place, and time. She appears well-developed and well-nourished. No distress.  HENT:  Head: Normocephalic and atraumatic.  Eyes: Pupils are equal, round, and reactive to light. Conjunctivae are normal.  Neck: No JVD present.  Cardiovascular: Normal rate, regular rhythm and intact distal pulses.  No murmur heard. Pulmonary/Chest: Effort normal and breath sounds normal. She has no wheezes. She has no rales.  Abdominal: Soft. Bowel sounds are normal. There is no rebound.  Musculoskeletal:        General: No edema.  Lymphadenopathy:    She has no cervical adenopathy.  Neurological: She is alert and oriented to person, place, and time. No cranial nerve deficit.  Skin: Skin is warm and dry.  Psychiatric: She has a normal mood and affect.  Nursing note and vitals reviewed.       Assessment & Recommendations:   68 y.o. Caucasian female with hypertension, hyperlipidemia, family history of early CAD, with diaphoresis, exertional dyspnea, exertional back pain.   Exertional dyspnea, back pain, diaphoresis: In the setting of her very strong family history of early CAD, hyperlipidemia, her symptoms are concerning for angina equivalent-in spite of her excellent baseline functional capacity.  Recommend echocardiogram and coronary CT angiogram for cardiac structural and coronary anatomy evaluation.  Resting heart rate is <55 bpm, which should give excellent CTA images.  Okay to continue aspirin 81 mg daily for now.  Avoid concurrent use of aspirin and meloxicam.  Hyperlipidemia: She has had myalgias with elevated creatinine kinase while on atorvastatin.  I will repeat creatinine kinase.  Based on CTA findings and creatinine kinase results, will then offer further lipid-lowering therapy recommendations.  Hypertension: Well-controlled.  Thank you for referring the patient to [place]. Please feel free to contact with any questions.  Nigel Mormon, MD [facility] Pager: [phone] Office: [phone]

## 2019-10-07 ENCOUNTER — Encounter: Payer: Self-pay | Admitting: Cardiology

## 2019-10-12 ENCOUNTER — Ambulatory Visit: Payer: PPO

## 2019-10-26 ENCOUNTER — Ambulatory Visit (INDEPENDENT_AMBULATORY_CARE_PROVIDER_SITE_OTHER): Payer: PPO

## 2019-10-26 ENCOUNTER — Other Ambulatory Visit: Payer: PPO

## 2019-10-26 ENCOUNTER — Other Ambulatory Visit: Payer: Self-pay

## 2019-10-26 DIAGNOSIS — R0609 Other forms of dyspnea: Secondary | ICD-10-CM

## 2019-10-26 DIAGNOSIS — R0789 Other chest pain: Secondary | ICD-10-CM | POA: Diagnosis not present

## 2019-10-26 DIAGNOSIS — R61 Generalized hyperhidrosis: Secondary | ICD-10-CM | POA: Diagnosis not present

## 2019-10-30 NOTE — Progress Notes (Signed)
Ok. Will follow up after CT scan scheduled in early February.

## 2019-10-30 NOTE — Progress Notes (Signed)
SOB and dizzyness from time to time, but nothing bad. She also checks her blood pressure daily and it is always good, per patient.

## 2019-10-30 NOTE — Progress Notes (Signed)
Patient is aware 

## 2019-11-08 ENCOUNTER — Other Ambulatory Visit: Payer: Self-pay | Admitting: Cardiology

## 2019-11-08 DIAGNOSIS — Z8249 Family history of ischemic heart disease and other diseases of the circulatory system: Secondary | ICD-10-CM

## 2019-11-08 DIAGNOSIS — R0789 Other chest pain: Secondary | ICD-10-CM

## 2019-11-17 DIAGNOSIS — M791 Myalgia, unspecified site: Secondary | ICD-10-CM | POA: Diagnosis not present

## 2019-11-19 LAB — CK: Total CK: 179 U/L (ref 32–182)

## 2019-11-24 ENCOUNTER — Telehealth (HOSPITAL_COMMUNITY): Payer: Self-pay | Admitting: Emergency Medicine

## 2019-11-24 NOTE — Telephone Encounter (Signed)
Left message on voicemail with name and callback number Infiniti Hoefling RN Navigator Cardiac [facility] [phone] Office [phone] Cell  

## 2019-11-27 ENCOUNTER — Ambulatory Visit (HOSPITAL_COMMUNITY)
Admission: RE | Admit: 2019-11-27 | Discharge: 2019-11-27 | Disposition: A | Discharge status: Home / Self Care | Payer: PPO | Source: Ambulatory Visit | Attending: Cardiology | Admitting: Cardiology

## 2019-11-27 ENCOUNTER — Other Ambulatory Visit: Payer: Self-pay

## 2019-11-27 ENCOUNTER — Encounter (HOSPITAL_COMMUNITY): Payer: Self-pay

## 2019-11-27 ENCOUNTER — Telehealth (HOSPITAL_COMMUNITY): Payer: Self-pay | Admitting: Emergency Medicine

## 2019-11-27 DIAGNOSIS — R072 Precordial pain: Secondary | ICD-10-CM | POA: Insufficient documentation

## 2019-11-27 DIAGNOSIS — I700 Atherosclerosis of aorta: Secondary | ICD-10-CM | POA: Diagnosis not present

## 2019-11-27 DIAGNOSIS — R0789 Other chest pain: Secondary | ICD-10-CM | POA: Diagnosis not present

## 2019-11-27 LAB — POCT I-STAT CREATININE: Creatinine, Ser: 0.6 mg/dL (ref 0.44–1.00)

## 2019-11-27 MED ORDER — IOHEXOL 350 MG/ML SOLN
80.0000 mL | Freq: Once | INTRAVENOUS | Status: AC | PRN
  Administered 2019-11-27: 80 mL via INTRAVENOUS

## 2019-11-27 MED ORDER — NITROGLYCERIN 0.4 MG SL SUBL: 0.8000 mg | SUBLINGUAL_TABLET | Freq: Once | SUBLINGUAL | Status: DC

## 2019-11-27 MED ORDER — NITROGLYCERIN 0.4 MG SL SUBL
SUBLINGUAL_TABLET | SUBLINGUAL | Status: AC
  Filled 2019-11-27: qty 2

## 2019-11-27 NOTE — Telephone Encounter (Signed)
Pt calling regarding upcoming cardiac imaging study; pt verbalizes understanding of appt date/time, parking situation and where to check in, pre-test NPO status and medications ordered, and verified current allergies; name and call back number provided for further questions should they arise Marchia Bond RN Navigator Cardiac [facility] [phone] office [phone] cell

## 2019-12-05 ENCOUNTER — Telehealth (HOSPITAL_COMMUNITY): Payer: Self-pay | Admitting: Emergency Medicine

## 2019-12-05 ENCOUNTER — Telehealth: Payer: Self-pay

## 2019-12-05 ENCOUNTER — Ambulatory Visit (HOSPITAL_COMMUNITY)
Admission: RE | Admit: 2019-12-05 | Discharge: 2019-12-05 | Disposition: A | Discharge status: Home / Self Care | Payer: PPO | Source: Ambulatory Visit | Attending: Cardiology | Admitting: Cardiology

## 2019-12-05 DIAGNOSIS — I700 Atherosclerosis of aorta: Secondary | ICD-10-CM | POA: Diagnosis not present

## 2019-12-05 DIAGNOSIS — R0789 Other chest pain: Secondary | ICD-10-CM | POA: Insufficient documentation

## 2019-12-05 DIAGNOSIS — R072 Precordial pain: Secondary | ICD-10-CM | POA: Diagnosis not present

## 2019-12-05 DIAGNOSIS — Z8249 Family history of ischemic heart disease and other diseases of the circulatory system: Secondary | ICD-10-CM | POA: Insufficient documentation

## 2019-12-05 MED ORDER — IOHEXOL 350 MG/ML SOLN
100.0000 mL | Freq: Once | INTRAVENOUS | Status: AC | PRN
  Administered 2019-12-05: 100 mL via INTRAVENOUS

## 2019-12-05 NOTE — Telephone Encounter (Signed)
I do not see the CT scan results in Epic yet. Can you please follow up with CTA dept re: the read?  Thanks MJP

## 2019-12-05 NOTE — Telephone Encounter (Signed)
error 

## 2019-12-06 NOTE — Telephone Encounter (Signed)
Per Jannet Askew: I sent a note over to the Nurse Navigator Marchia Bond, she is checking with the [facility], I will let you know as soon as I hear from her.  Thank you, and I am sorry for any inconvenience this is causing. Jannet Askew ""    I do see a CT report under "Encounter ROI" under 11/27/19 date. Let me know if that's what you are looking for, thank you!  -[place]

## 2019-12-07 ENCOUNTER — Other Ambulatory Visit: Payer: Self-pay | Admitting: Cardiology

## 2019-12-07 DIAGNOSIS — E782 Mixed hyperlipidemia: Secondary | ICD-10-CM

## 2019-12-07 MED ORDER — ROSUVASTATIN CALCIUM 10 MG PO TABS: 10.0000 mg | ORAL_TABLET | Freq: Every day | ORAL | 3 refills | Status: DC

## 2019-12-07 NOTE — Progress Notes (Signed)
Repeat CTA pulmonary results with the patient.  0 calcium score and no significant coronary artery disease left-sided arteries.  RCA could not be visualized due to motion artifact.  Patient has not had any severe symptoms since her last visit with me.  Recommend continued current medical management, including resumption of statin, that her CK has normalized.  Recommend Crestor 10 mg daily.  I will see her in April.

## 2020-01-17 DIAGNOSIS — R05 Cough: Secondary | ICD-10-CM | POA: Diagnosis not present

## 2020-01-17 DIAGNOSIS — J309 Allergic rhinitis, unspecified: Secondary | ICD-10-CM | POA: Diagnosis not present

## 2020-01-17 DIAGNOSIS — J069 Acute upper respiratory infection, unspecified: Secondary | ICD-10-CM | POA: Diagnosis not present

## 2020-01-17 DIAGNOSIS — Z1152 Encounter for screening for COVID-19: Secondary | ICD-10-CM | POA: Diagnosis not present

## 2020-02-04 DIAGNOSIS — Z8249 Family history of ischemic heart disease and other diseases of the circulatory system: Secondary | ICD-10-CM | POA: Insufficient documentation

## 2020-02-04 DIAGNOSIS — E782 Mixed hyperlipidemia: Secondary | ICD-10-CM | POA: Insufficient documentation

## 2020-02-04 DIAGNOSIS — R072 Precordial pain: Secondary | ICD-10-CM | POA: Insufficient documentation

## 2020-02-04 DIAGNOSIS — R0609 Other forms of dyspnea: Secondary | ICD-10-CM | POA: Insufficient documentation

## 2020-02-04 NOTE — Progress Notes (Signed)
Patient referred by Velna Hatchet, MD for shortness of breath, back pain, family h/o CAD.  Subjective:   Kelsey Cook, female    DOB: 01-Jan-1951, 69 y.o.   MRN: [id number]   Chief Complaint  Patient presents with  . family history of CAD  . Follow-up  . Results    lab  . Hypertension     HPI  69 y.o. Caucasian female with hypertension, hyperlipidemia, family history of early CAD, referred for evaluation of shortness of breath, diaphoresis.  Patients CTA was limited in evaluation of RCA and LCx, although calcium score was 0. No significant disease noted in LM and LAD.   She is able to walk 6-10 miles everyday without any symptoms. She has occasional episodes of perspiration in the middle of night, without any chest pain or shortness of breath.   Initial consultation HPI 09/2019: Patient is originally from [place], moved to [place] in 2015.  She is above risk your.  She walks 16 miles daily with her dogs at a leisurely pace.  Recently, she has experienced shortness of breath on walking.  She has also noticed pain in her back on walking, that is relieved with rest.  In addition, she has had episodes of profuse sweating for no particular reason, according to her.  Patient has significant family history of early coronary artery disease.  Several family members on maternal side have had MI in 49s and 28s.  Underwent coronary angiography 10 years ago, did not have any intervention, but simvastatin was changed to atorvastatin.  While on atorvastatin, she has had myalgias with elevated creatinine kinase of 585 noted in June 2020.  She stopped atorvastatin a week ago.      Current Outpatient Medications on File Prior to Visit  Medication Sig Dispense Refill  . alendronate (FOSAMAX) 5 MG tablet Take 70 mg by mouth once a week. Take with a full glass of water on an empty stomach.     Marland Kitchen aspirin EC 81 MG tablet Take 81 mg by mouth daily.    Marland Kitchen glucose blood test strip TEST  BLOOD SUGAR TWICE DAILY    . lisinopril (PRINIVIL,ZESTRIL) 10 MG tablet Take 10 mg by mouth daily.    . meloxicam (MOBIC) 15 MG tablet TAKE ONE (1) TABLET BY MOUTH EVERY DAY WITH FOOD    . Multiple Vitamin (MULTIVITAMIN) capsule Take 1 capsule by mouth daily. Centrum Silver for WOmen    . nitrofurantoin, macrocrystal-monohydrate, (MACROBID) 100 MG capsule Take 100 mg by mouth daily.    Marland Kitchen omeprazole (PRILOSEC) 40 MG capsule Take 40 mg by mouth daily.    . rosuvastatin (CRESTOR) 10 MG tablet Take 1 tablet (10 mg total) by mouth daily. 30 tablet 3  . traMADol (ULTRAM) 50 MG tablet Take by mouth every 6 (six) hours as needed.     No current facility-administered medications on file prior to visit.    Cardiovascular and other pertinent studies:  CTA coronary 12/17/2019: 1. Coronary calcium score of 0. This was 0 percentile for age and sex matched control. 2. Normal coronary origin with right dominance. 3. No evidence of atherosclerotic plaque in the LAD. The ostial/proximal RCA and mid to distal LCx are not adequately visualized due to motion artifact to rule out noncalcified plaque. 4. Recommend different imaging modality such as nuclear stress testing for further workup of chest pain.  Echocardiogram 10/26/2019:  Left ventricle cavity is normal in size. Mild concentric hypertrophy of  the left ventricle. Normal global  wall motion. Normal LV systolic function  with EF 67%. Normal diastolic filling pattern. Calculated EF 67%.  Trileaflet aortic valve. Trace aortic regurgitation.  Mild to moderate mitral regurgitation.  Mild tricuspid regurgitation. Estimated pulmonary artery systolic pressure  is 31 mmHg.  IVC is dilated with respiratory variation. Estimated RA pressure 8 mmHg.  No significant change compared to previous study in 2017.  EKG 10/06/2019: Sinus bradycardia 48 bpm. Otherwise normal EKG.  CT Chest 07/31/2019: 1. Mild extrahepatic biliary dilatation with the common bile  duct at 0.8 cm. The gallbladder appears unremarkable. 2. 2 hepatic cysts are present, and there is a third sub-centimeter hepatic hypodense lesion which is technically too small to characterize, although statistically likely to be benign. 3. 4 mm right lower lobe pulmonary nodule and 2 mm lingular nodule. No follow-up needed if patient is low-risk (and has no known or suspected primary neoplasm). Non-contrast chest CT can be considered in 12 months if patient is high-risk. This recommendation follows the consensus statement: Guidelines for Management of Incidental Pulmonary Nodules Detected on CT Images: From the Fleischner Society 2017; Radiology 2017; 284:228-243. 4. Other imaging findings of potential clinical significance: Aortic Atherosclerosis (ICD10-I70.0). Mild cardiomegaly. Mild left foraminal impingement at L5-S1.   Recent labs: 11/17/2019: CK 179 normal  05/08/2019: Glucose 99, BUN/Cr 25/0.8. EGFR 71. Na/K 138/4.5. Rest of the CMP normal H/H 13/42. MCV 94. Platelets 250 Chol 210, TG 60, HDL 64, LDL 134 Apolipoprotein B 95 (49-90) TSH normal  04/05/2019: CK 585 (32-182)    Review of Systems  Cardiovascular: Negative for chest pain, dyspnea on exertion, leg swelling, palpitations and syncope.    Vitals:   02/05/20 1050  BP: (!) 157/84  Pulse: (!) 56  Resp: 18  Temp: 98 F (36.7 C)  SpO2: 99%     Body mass index is 28.67 kg/m. Filed Weights   02/05/20 1050  Weight: 167 lb (75.8 kg)     Objective:   Physical Exam  Constitutional: She appears well-developed and well-nourished.  Neck: No JVD present.  Cardiovascular: Normal rate, regular rhythm, normal heart sounds and intact distal pulses.  No murmur heard. Pulmonary/Chest: Effort normal and breath sounds normal. She has no wheezes. She has no rales.  Musculoskeletal:        General: No edema.  Nursing note and vitals reviewed.       Assessment & Recommendations:   69 y.o. Caucasian female  with hypertension, hyperlipidemia, family history of early CAD, with diaphoresis, exertional dyspnea, exertional back pain.  Exertional dyspnea, back pain, diaphoresis: Patients CTA was limited in evaluation of RCA and LCx, although calcium score was 0. No significant disease noted in LM and LAD. I do not think her symptoms are related to CAD. Okay to stop Aspirin.  Hyperlipidemia: Tolerating Crestor 10 mg well without any symptoms. CK normal in 10/2019. Continue annual lipid panel with PCP.  Hypertension: Well controlled.  Valvular regurgitation: Trace AI, mild to mod MR, mild TR, clinically insignificant. Repeat echocardiogram in 2 yrs  F/u w/me prn.  Nigel Mormon, MD [facility] Pager: [phone] Office: [phone]

## 2020-02-05 ENCOUNTER — Other Ambulatory Visit: Payer: Self-pay

## 2020-02-05 ENCOUNTER — Ambulatory Visit: Payer: PPO | Admitting: Cardiology

## 2020-02-05 ENCOUNTER — Encounter: Payer: Self-pay | Admitting: Cardiology

## 2020-02-05 VITALS — BP 157/84 | HR 56 | Temp 98.0°F | Resp 18 | Ht 64.0 in | Wt 167.0 lb

## 2020-02-05 DIAGNOSIS — I340 Nonrheumatic mitral (valve) insufficiency: Secondary | ICD-10-CM | POA: Insufficient documentation

## 2020-02-05 DIAGNOSIS — Z8249 Family history of ischemic heart disease and other diseases of the circulatory system: Secondary | ICD-10-CM | POA: Diagnosis not present

## 2020-02-05 DIAGNOSIS — R072 Precordial pain: Secondary | ICD-10-CM

## 2020-02-05 DIAGNOSIS — E782 Mixed hyperlipidemia: Secondary | ICD-10-CM | POA: Diagnosis not present

## 2020-02-05 DIAGNOSIS — R0609 Other forms of dyspnea: Secondary | ICD-10-CM

## 2020-03-03 ENCOUNTER — Other Ambulatory Visit: Payer: Self-pay | Admitting: Cardiology

## 2020-03-03 DIAGNOSIS — E782 Mixed hyperlipidemia: Secondary | ICD-10-CM

## 2020-03-05 DIAGNOSIS — L301 Dyshidrosis [pompholyx]: Secondary | ICD-10-CM | POA: Diagnosis not present

## 2020-04-17 DIAGNOSIS — I10 Essential (primary) hypertension: Secondary | ICD-10-CM | POA: Diagnosis not present

## 2020-04-17 DIAGNOSIS — R252 Cramp and spasm: Secondary | ICD-10-CM | POA: Diagnosis not present

## 2020-04-17 DIAGNOSIS — K252 Acute gastric ulcer with both hemorrhage and perforation: Secondary | ICD-10-CM | POA: Diagnosis not present

## 2020-04-17 DIAGNOSIS — R296 Repeated falls: Secondary | ICD-10-CM | POA: Diagnosis not present

## 2020-04-17 DIAGNOSIS — R42 Dizziness and giddiness: Secondary | ICD-10-CM | POA: Diagnosis not present

## 2020-04-17 DIAGNOSIS — R0781 Pleurodynia: Secondary | ICD-10-CM | POA: Diagnosis not present

## 2020-04-23 DIAGNOSIS — R42 Dizziness and giddiness: Secondary | ICD-10-CM | POA: Diagnosis not present

## 2020-04-23 DIAGNOSIS — R296 Repeated falls: Secondary | ICD-10-CM | POA: Diagnosis not present

## 2020-05-21 DIAGNOSIS — E7849 Other hyperlipidemia: Secondary | ICD-10-CM | POA: Diagnosis not present

## 2020-05-27 DIAGNOSIS — M810 Age-related osteoporosis without current pathological fracture: Secondary | ICD-10-CM | POA: Diagnosis not present

## 2020-05-27 DIAGNOSIS — Z1212 Encounter for screening for malignant neoplasm of rectum: Secondary | ICD-10-CM | POA: Diagnosis not present

## 2020-05-27 DIAGNOSIS — I10 Essential (primary) hypertension: Secondary | ICD-10-CM | POA: Diagnosis not present

## 2020-05-27 DIAGNOSIS — R300 Dysuria: Secondary | ICD-10-CM | POA: Diagnosis not present

## 2020-05-27 DIAGNOSIS — I340 Nonrheumatic mitral (valve) insufficiency: Secondary | ICD-10-CM | POA: Diagnosis not present

## 2020-05-27 DIAGNOSIS — E785 Hyperlipidemia, unspecified: Secondary | ICD-10-CM | POA: Diagnosis not present

## 2020-05-27 DIAGNOSIS — N3000 Acute cystitis without hematuria: Secondary | ICD-10-CM | POA: Diagnosis not present

## 2020-05-27 DIAGNOSIS — N390 Urinary tract infection, site not specified: Secondary | ICD-10-CM | POA: Diagnosis not present

## 2020-05-27 DIAGNOSIS — Z0000 Encounter for general adult medical examination without abnormal findings: Secondary | ICD-10-CM | POA: Diagnosis not present

## 2020-05-27 DIAGNOSIS — K219 Gastro-esophageal reflux disease without esophagitis: Secondary | ICD-10-CM | POA: Diagnosis not present

## 2020-05-27 DIAGNOSIS — M160 Bilateral primary osteoarthritis of hip: Secondary | ICD-10-CM | POA: Diagnosis not present

## 2020-05-27 DIAGNOSIS — H9209 Otalgia, unspecified ear: Secondary | ICD-10-CM | POA: Diagnosis not present

## 2020-05-27 DIAGNOSIS — Z8249 Family history of ischemic heart disease and other diseases of the circulatory system: Secondary | ICD-10-CM | POA: Diagnosis not present

## 2020-05-30 DIAGNOSIS — R300 Dysuria: Secondary | ICD-10-CM | POA: Diagnosis not present

## 2020-05-30 DIAGNOSIS — Z1212 Encounter for screening for malignant neoplasm of rectum: Secondary | ICD-10-CM | POA: Diagnosis not present

## 2020-06-23 ENCOUNTER — Other Ambulatory Visit: Payer: Self-pay | Admitting: Cardiology

## 2020-06-23 DIAGNOSIS — E782 Mixed hyperlipidemia: Secondary | ICD-10-CM

## 2020-07-29 DIAGNOSIS — K219 Gastro-esophageal reflux disease without esophagitis: Secondary | ICD-10-CM | POA: Diagnosis not present

## 2020-07-29 DIAGNOSIS — R0600 Dyspnea, unspecified: Secondary | ICD-10-CM | POA: Diagnosis not present

## 2020-07-29 DIAGNOSIS — R1012 Left upper quadrant pain: Secondary | ICD-10-CM | POA: Diagnosis not present

## 2020-07-29 DIAGNOSIS — R1011 Right upper quadrant pain: Secondary | ICD-10-CM | POA: Diagnosis not present

## 2020-07-29 DIAGNOSIS — I10 Essential (primary) hypertension: Secondary | ICD-10-CM | POA: Diagnosis not present

## 2020-07-29 DIAGNOSIS — I340 Nonrheumatic mitral (valve) insufficiency: Secondary | ICD-10-CM | POA: Diagnosis not present

## 2020-07-30 ENCOUNTER — Other Ambulatory Visit: Payer: Self-pay | Admitting: Registered Nurse

## 2020-07-30 ENCOUNTER — Ambulatory Visit
Admission: RE | Admit: 2020-07-30 | Discharge: 2020-07-30 | Disposition: A | Discharge status: Home / Self Care | Payer: PPO | Source: Ambulatory Visit | Attending: Registered Nurse | Admitting: Registered Nurse

## 2020-07-30 DIAGNOSIS — M4696 Unspecified inflammatory spondylopathy, lumbar region: Secondary | ICD-10-CM | POA: Diagnosis not present

## 2020-07-30 DIAGNOSIS — R1012 Left upper quadrant pain: Secondary | ICD-10-CM

## 2020-07-30 DIAGNOSIS — M4316 Spondylolisthesis, lumbar region: Secondary | ICD-10-CM | POA: Diagnosis not present

## 2020-07-30 DIAGNOSIS — R1011 Right upper quadrant pain: Secondary | ICD-10-CM

## 2020-07-30 DIAGNOSIS — I700 Atherosclerosis of aorta: Secondary | ICD-10-CM | POA: Diagnosis not present

## 2020-07-30 DIAGNOSIS — K3189 Other diseases of stomach and duodenum: Secondary | ICD-10-CM | POA: Diagnosis not present

## 2020-07-30 MED ORDER — IOPAMIDOL (ISOVUE-300) INJECTION 61%
100.0000 mL | Freq: Once | INTRAVENOUS | Status: AC | PRN
  Administered 2020-07-30: 100 mL via INTRAVENOUS

## 2020-07-31 ENCOUNTER — Other Ambulatory Visit: Payer: Self-pay | Admitting: Registered Nurse

## 2020-07-31 DIAGNOSIS — R1012 Left upper quadrant pain: Secondary | ICD-10-CM

## 2020-07-31 DIAGNOSIS — R1011 Right upper quadrant pain: Secondary | ICD-10-CM

## 2020-09-09 DIAGNOSIS — Z01419 Encounter for gynecological examination (general) (routine) without abnormal findings: Secondary | ICD-10-CM | POA: Diagnosis not present

## 2020-09-09 DIAGNOSIS — Z124 Encounter for screening for malignant neoplasm of cervix: Secondary | ICD-10-CM | POA: Diagnosis not present

## 2020-09-09 DIAGNOSIS — Z1231 Encounter for screening mammogram for malignant neoplasm of breast: Secondary | ICD-10-CM | POA: Diagnosis not present

## 2020-09-09 DIAGNOSIS — Z803 Family history of malignant neoplasm of breast: Secondary | ICD-10-CM | POA: Diagnosis not present

## 2020-09-09 DIAGNOSIS — M8588 Other specified disorders of bone density and structure, other site: Secondary | ICD-10-CM | POA: Diagnosis not present

## 2020-09-09 DIAGNOSIS — N958 Other specified menopausal and perimenopausal disorders: Secondary | ICD-10-CM | POA: Diagnosis not present

## 2020-09-09 DIAGNOSIS — Z6828 Body mass index (BMI) 28.0-28.9, adult: Secondary | ICD-10-CM | POA: Diagnosis not present

## 2021-02-25 DIAGNOSIS — L259 Unspecified contact dermatitis, unspecified cause: Secondary | ICD-10-CM | POA: Diagnosis not present

## 2021-03-31 DIAGNOSIS — L259 Unspecified contact dermatitis, unspecified cause: Secondary | ICD-10-CM | POA: Diagnosis not present

## 2021-04-12 IMAGING — CT CT CHEST W/ CM
3 series · 15 of 32 positions shown, 18 images · IV contrast (APPLIED)
Comparison: Abdominal ultrasound 12/21/2018

CLINICAL DATA: Shortness of breath. Right upper quadrant abdominal
pain.

EXAM:
CT CHEST AND ABDOMEN WITH CONTRAST
TECHNIQUE: Multidetector CT imaging of the chest and abdomen was performed
following the standard protocol during bolus administration of
intravenous contrast.
CONTRAST:  100mL KKKSCH-VCC IOPAMIDOL (KKKSCH-VCC) INJECTION 61%

[Series 2: chest/abd/pelvis w/cm · axial · 0.75mm/px · z∈[-289,+71]mm · 8 of 91 slices shown]
[im 10/91  soft-tissue]
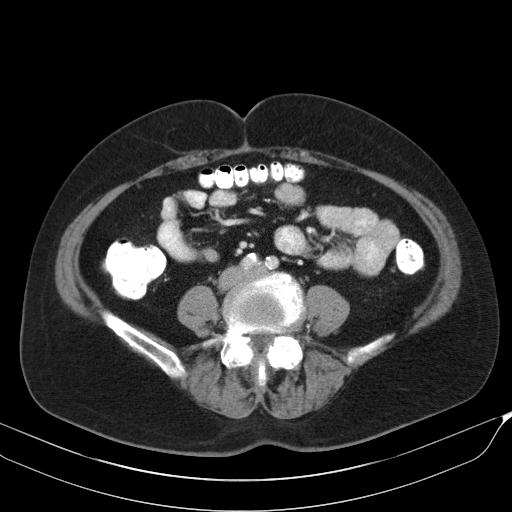
[im 19/91  soft-tissue]
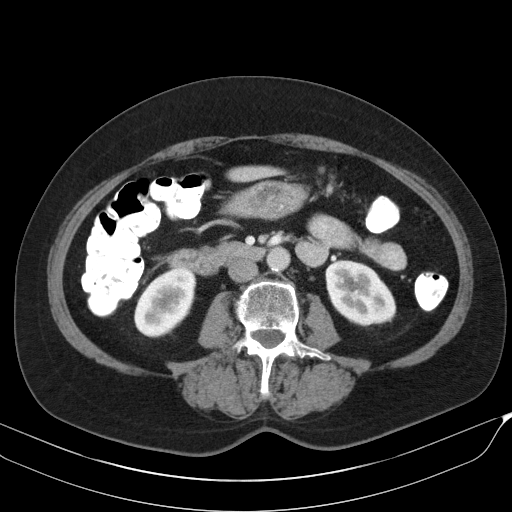
[im 28/91  soft-tissue]
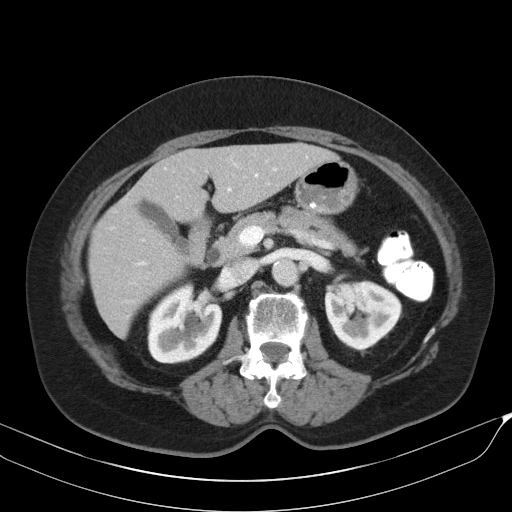
[im 37/91  soft-tissue]
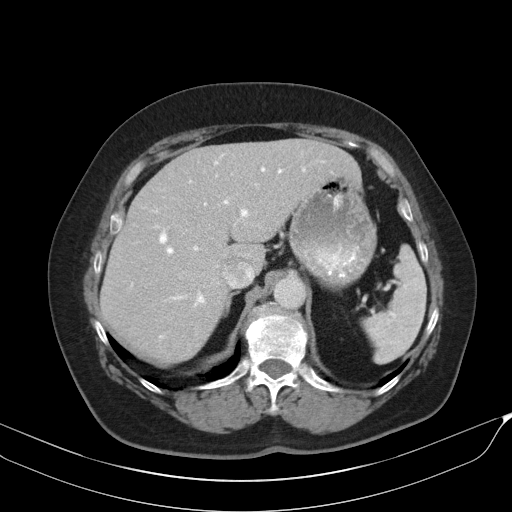
[im 55/91  soft-tissue]
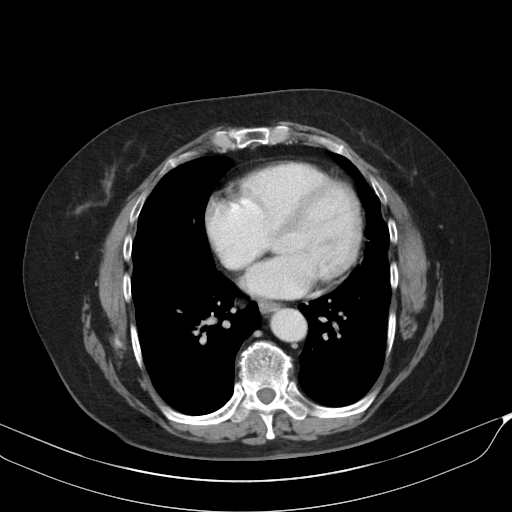
[im 64/91  soft-tissue]
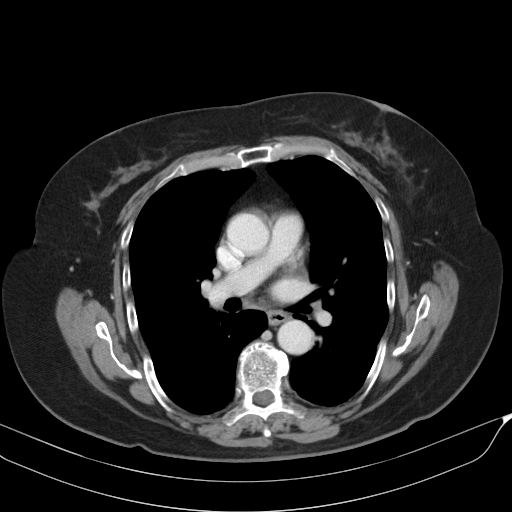
[im 73/91  soft-tissue]
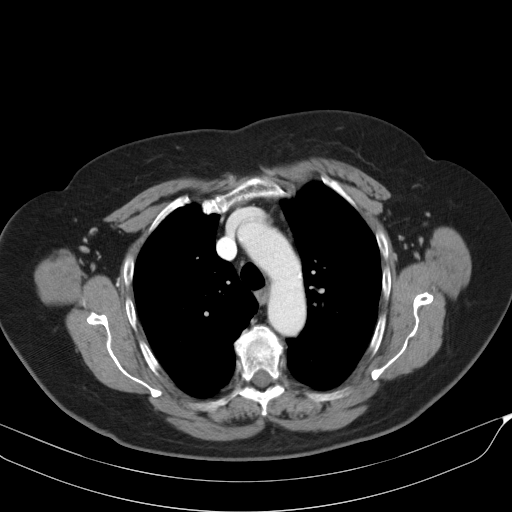
[im 82/91  soft-tissue]
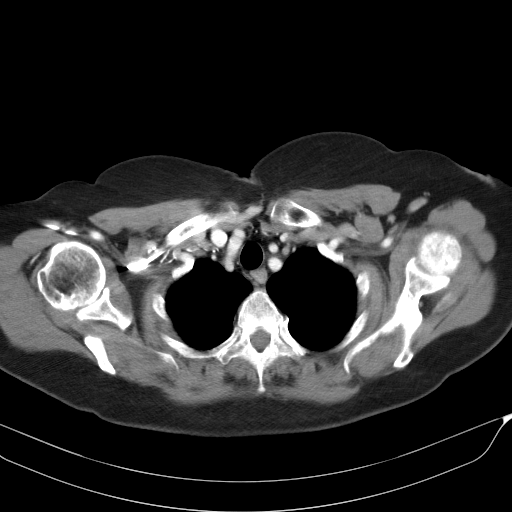

[Series 6: lung · axial · 0.75mm/px · z∈[-152,-10]mm · 5 of 143 slices shown]
[im 9/143  bone]
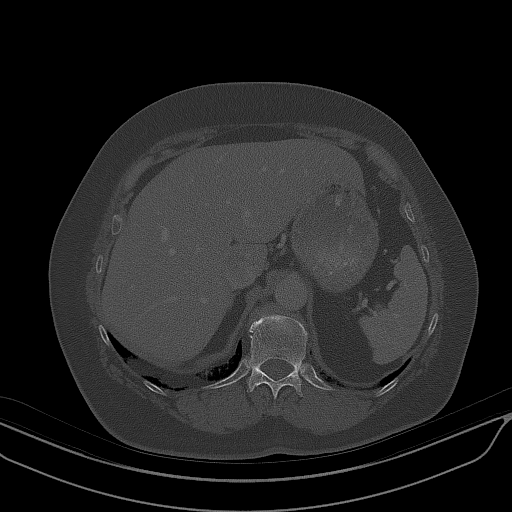
[im 27/143  bone]
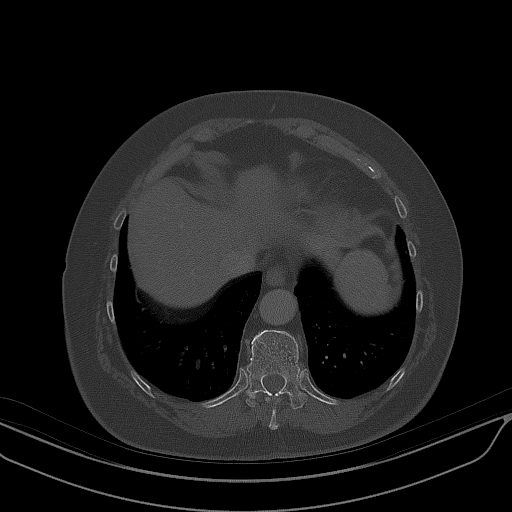
[im 45/143  bone]
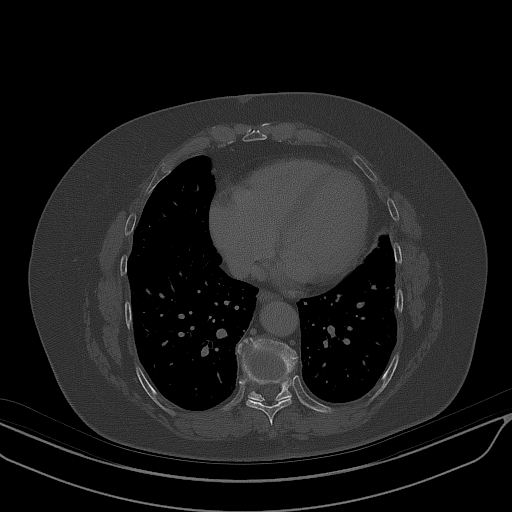
[im 63/143  bone]
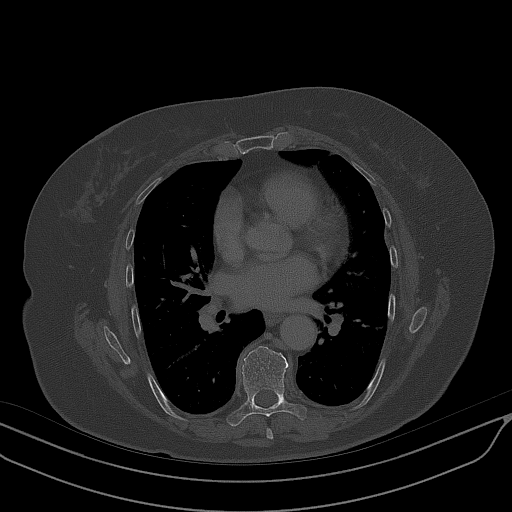
[im 80/143  bone]
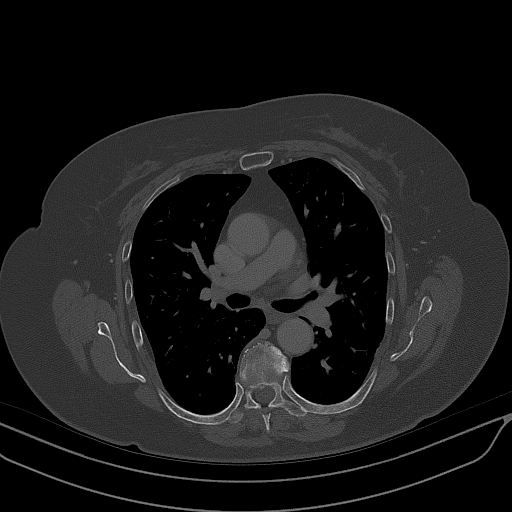

[Series 7: renal delay · axial · delayed · 0.75mm/px · z∈[-229,-184]mm · 2 of 29 slices shown, 5 images]
[im 10/29  soft-tissue]
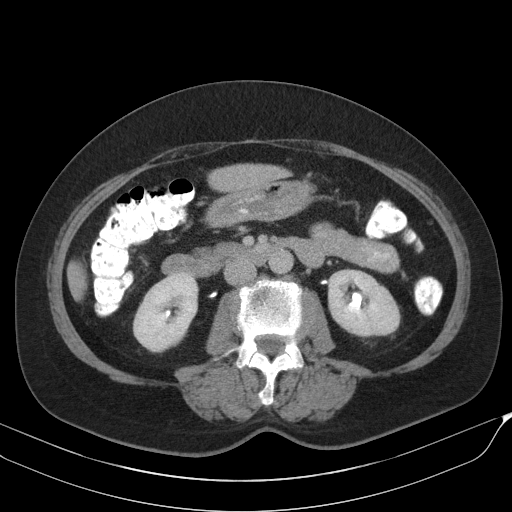
[im 10/29  lung]
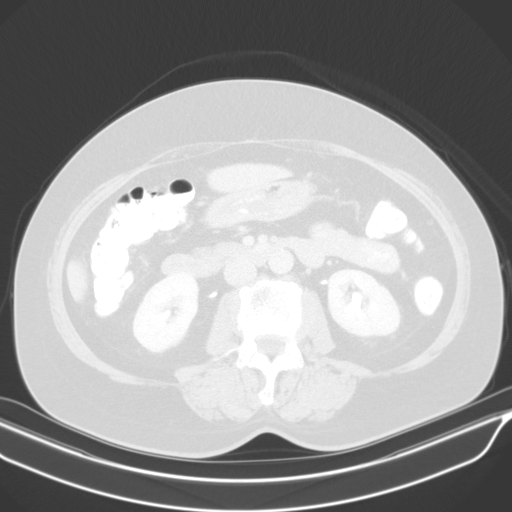
[im 10/29  bone]
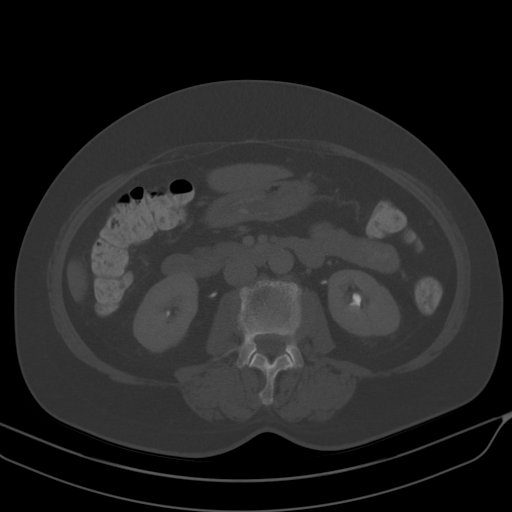
[im 19/29  soft-tissue]
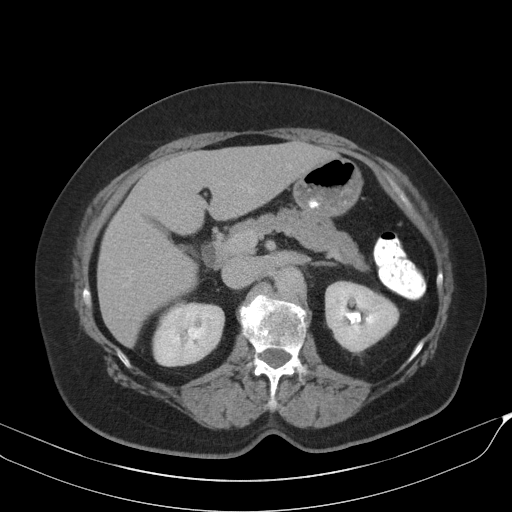
[im 19/29  lung]
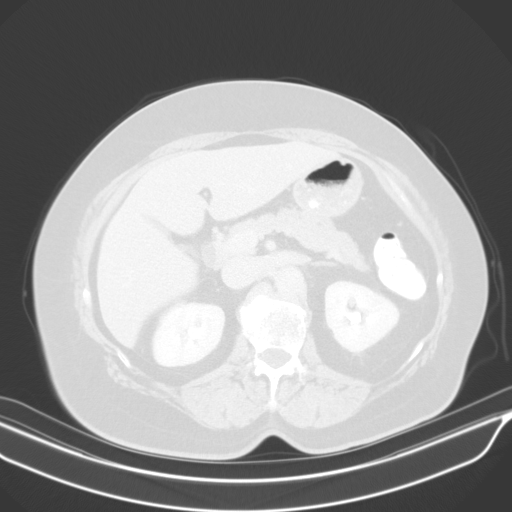

[15 of 32 positions shown; findings below may reference images not displayed]

FINDINGS: CT CHEST FINDINGS

Cardiovascular: Mild atherosclerotic calcification of the aortic
arch and branch vessels. Mild cardiomegaly.

Mediastinum/Nodes: Calcified right paratracheal lymph node. No
pathologic adenopathy.

Lungs/Pleura: 0.4 by 0.3 cm right lower lobe pulmonary nodule on
image 101/6. 2 mm subpleural nodule in the lingula, image 79/6.

Musculoskeletal: Degenerative glenohumeral arthropathy bilaterally.
Thoracic spondylosis.

CT ABDOMEN FINDINGS

Hepatobiliary: 2.0 by 1.8 cm cyst anteriorly in the left hepatic
lobe on image 53/2. 1.9 by 1.2 cm cyst along the falciform ligament
on image 57/2. 0.7 by 0.6 cm hypodense lesion in segment 2 of the
liver on image 49/2 is technically too small to characterize,
although statistically likely to be benign. Gallbladder
unremarkable.

The common bile duct measures 0.8 cm in diameter, mildly prominent
for age. No intrahepatic biliary dilatation.

Pancreas: Unremarkable

Spleen: Unremarkable

Adrenals/Urinary Tract: Unremarkable. Today's exam did not include
the pelvis so the distal ureters and bladder are not assessed.

Stomach/Bowel: Unremarkable where included.

Vascular/Lymphatic: Aortoiliac atherosclerotic vascular disease. No
pathologic adenopathy in the upper abdomen.

Other: No supplemental non-categorized findings.

Musculoskeletal: Grade 1 degenerative anterolisthesis at L4-5.
Multilevel degenerative disc disease in the lumbar spine with left
foraminal impingement at L5-S1 and borderline foraminal impingement
on the left at L3-4 and L4-5, and on the right at L4-5.
IMPRESSION: 1. Mild extrahepatic biliary dilatation with the common bile duct at
0.8 cm. The gallbladder appears unremarkable.
2. 2 hepatic cysts are present, and there is a third sub-centimeter
hepatic hypodense lesion which is technically too small to
characterize, although statistically likely to be benign.
3. 4 mm right lower lobe pulmonary nodule and 2 mm lingular nodule.
No follow-up needed if patient is low-risk (and has no known or
suspected primary neoplasm). Non-contrast chest CT can be considered
in 12 months if patient is high-risk. This recommendation follows
the consensus statement: Guidelines for Management of Incidental
Pulmonary Nodules Detected on CT Images: From the [HOSPITAL]
4. Other imaging findings of potential clinical significance: Aortic
Atherosclerosis ([id number]). Mild cardiomegaly. Mild left
foraminal impingement at L5-S1.

## 2021-04-15 DIAGNOSIS — Z1152 Encounter for screening for COVID-19: Secondary | ICD-10-CM | POA: Diagnosis not present

## 2021-04-15 DIAGNOSIS — W57XXXA Bitten or stung by nonvenomous insect and other nonvenomous arthropods, initial encounter: Secondary | ICD-10-CM | POA: Diagnosis not present

## 2021-04-15 DIAGNOSIS — S80861A Insect bite (nonvenomous), right lower leg, initial encounter: Secondary | ICD-10-CM | POA: Diagnosis not present

## 2021-04-15 DIAGNOSIS — R42 Dizziness and giddiness: Secondary | ICD-10-CM | POA: Diagnosis not present

## 2021-04-15 DIAGNOSIS — R52 Pain, unspecified: Secondary | ICD-10-CM | POA: Diagnosis not present

## 2021-06-03 DIAGNOSIS — E785 Hyperlipidemia, unspecified: Secondary | ICD-10-CM | POA: Diagnosis not present

## 2021-06-03 DIAGNOSIS — M810 Age-related osteoporosis without current pathological fracture: Secondary | ICD-10-CM | POA: Diagnosis not present

## 2021-06-10 DIAGNOSIS — E785 Hyperlipidemia, unspecified: Secondary | ICD-10-CM | POA: Diagnosis not present

## 2021-06-10 DIAGNOSIS — Z0000 Encounter for general adult medical examination without abnormal findings: Secondary | ICD-10-CM | POA: Diagnosis not present

## 2021-06-10 DIAGNOSIS — R300 Dysuria: Secondary | ICD-10-CM | POA: Diagnosis not present

## 2021-06-10 DIAGNOSIS — I340 Nonrheumatic mitral (valve) insufficiency: Secondary | ICD-10-CM | POA: Diagnosis not present

## 2021-06-10 DIAGNOSIS — M810 Age-related osteoporosis without current pathological fracture: Secondary | ICD-10-CM | POA: Diagnosis not present

## 2021-06-10 DIAGNOSIS — K219 Gastro-esophageal reflux disease without esophagitis: Secondary | ICD-10-CM | POA: Diagnosis not present

## 2021-06-10 DIAGNOSIS — M25562 Pain in left knee: Secondary | ICD-10-CM | POA: Diagnosis not present

## 2021-06-10 DIAGNOSIS — I10 Essential (primary) hypertension: Secondary | ICD-10-CM | POA: Diagnosis not present

## 2021-09-04 DIAGNOSIS — M25562 Pain in left knee: Secondary | ICD-10-CM | POA: Diagnosis not present

## 2021-12-10 DIAGNOSIS — F430 Acute stress reaction: Secondary | ICD-10-CM | POA: Diagnosis not present

## 2021-12-10 DIAGNOSIS — F331 Major depressive disorder, recurrent, moderate: Secondary | ICD-10-CM | POA: Diagnosis not present

## 2021-12-10 DIAGNOSIS — I10 Essential (primary) hypertension: Secondary | ICD-10-CM | POA: Diagnosis not present

## 2021-12-10 DIAGNOSIS — M810 Age-related osteoporosis without current pathological fracture: Secondary | ICD-10-CM | POA: Diagnosis not present

## 2021-12-10 DIAGNOSIS — I340 Nonrheumatic mitral (valve) insufficiency: Secondary | ICD-10-CM | POA: Diagnosis not present

## 2022-01-06 DIAGNOSIS — Z1151 Encounter for screening for human papillomavirus (HPV): Secondary | ICD-10-CM | POA: Diagnosis not present

## 2022-01-06 DIAGNOSIS — Z6828 Body mass index (BMI) 28.0-28.9, adult: Secondary | ICD-10-CM | POA: Diagnosis not present

## 2022-01-06 DIAGNOSIS — M8580 Other specified disorders of bone density and structure, unspecified site: Secondary | ICD-10-CM | POA: Diagnosis not present

## 2022-01-06 DIAGNOSIS — N393 Stress incontinence (female) (male): Secondary | ICD-10-CM | POA: Diagnosis not present

## 2022-01-06 DIAGNOSIS — R309 Painful micturition, unspecified: Secondary | ICD-10-CM | POA: Diagnosis not present

## 2022-01-06 DIAGNOSIS — Z124 Encounter for screening for malignant neoplasm of cervix: Secondary | ICD-10-CM | POA: Diagnosis not present

## 2022-01-06 DIAGNOSIS — Z1231 Encounter for screening mammogram for malignant neoplasm of breast: Secondary | ICD-10-CM | POA: Diagnosis not present

## 2022-01-06 DIAGNOSIS — R87619 Unspecified abnormal cytological findings in specimens from cervix uteri: Secondary | ICD-10-CM | POA: Diagnosis not present

## 2022-01-06 DIAGNOSIS — Z803 Family history of malignant neoplasm of breast: Secondary | ICD-10-CM | POA: Diagnosis not present

## 2022-01-08 DIAGNOSIS — M25562 Pain in left knee: Secondary | ICD-10-CM | POA: Diagnosis not present

## 2022-02-05 ENCOUNTER — Ambulatory Visit: Payer: PPO

## 2022-02-05 DIAGNOSIS — I340 Nonrheumatic mitral (valve) insufficiency: Secondary | ICD-10-CM | POA: Diagnosis not present

## 2022-02-05 DIAGNOSIS — M25562 Pain in left knee: Secondary | ICD-10-CM | POA: Diagnosis not present

## 2022-04-08 DIAGNOSIS — M25562 Pain in left knee: Secondary | ICD-10-CM | POA: Diagnosis not present

## 2022-05-19 DIAGNOSIS — M170 Bilateral primary osteoarthritis of knee: Secondary | ICD-10-CM | POA: Diagnosis not present

## 2022-06-15 DIAGNOSIS — I10 Essential (primary) hypertension: Secondary | ICD-10-CM | POA: Diagnosis not present

## 2022-06-15 DIAGNOSIS — M810 Age-related osteoporosis without current pathological fracture: Secondary | ICD-10-CM | POA: Diagnosis not present

## 2022-06-15 DIAGNOSIS — E785 Hyperlipidemia, unspecified: Secondary | ICD-10-CM | POA: Diagnosis not present

## 2022-06-15 DIAGNOSIS — R7989 Other specified abnormal findings of blood chemistry: Secondary | ICD-10-CM | POA: Diagnosis not present

## 2022-06-24 DIAGNOSIS — M25562 Pain in left knee: Secondary | ICD-10-CM | POA: Diagnosis not present

## 2022-06-24 DIAGNOSIS — Z1339 Encounter for screening examination for other mental health and behavioral disorders: Secondary | ICD-10-CM | POA: Diagnosis not present

## 2022-06-24 DIAGNOSIS — K219 Gastro-esophageal reflux disease without esophagitis: Secondary | ICD-10-CM | POA: Diagnosis not present

## 2022-06-24 DIAGNOSIS — M1990 Unspecified osteoarthritis, unspecified site: Secondary | ICD-10-CM | POA: Diagnosis not present

## 2022-06-24 DIAGNOSIS — Z0000 Encounter for general adult medical examination without abnormal findings: Secondary | ICD-10-CM | POA: Diagnosis not present

## 2022-06-24 DIAGNOSIS — R82998 Other abnormal findings in urine: Secondary | ICD-10-CM | POA: Diagnosis not present

## 2022-06-24 DIAGNOSIS — M810 Age-related osteoporosis without current pathological fracture: Secondary | ICD-10-CM | POA: Diagnosis not present

## 2022-06-24 DIAGNOSIS — I10 Essential (primary) hypertension: Secondary | ICD-10-CM | POA: Diagnosis not present

## 2022-06-24 DIAGNOSIS — Z1331 Encounter for screening for depression: Secondary | ICD-10-CM | POA: Diagnosis not present

## 2022-06-24 DIAGNOSIS — I700 Atherosclerosis of aorta: Secondary | ICD-10-CM | POA: Diagnosis not present

## 2022-06-24 DIAGNOSIS — E785 Hyperlipidemia, unspecified: Secondary | ICD-10-CM | POA: Diagnosis not present

## 2022-06-24 DIAGNOSIS — I340 Nonrheumatic mitral (valve) insufficiency: Secondary | ICD-10-CM | POA: Diagnosis not present

## 2022-07-28 DIAGNOSIS — M1712 Unilateral primary osteoarthritis, left knee: Secondary | ICD-10-CM | POA: Diagnosis not present

## 2022-08-04 DIAGNOSIS — M1712 Unilateral primary osteoarthritis, left knee: Secondary | ICD-10-CM | POA: Diagnosis not present

## 2022-08-11 DIAGNOSIS — M1712 Unilateral primary osteoarthritis, left knee: Secondary | ICD-10-CM | POA: Diagnosis not present

## 2022-08-17 DIAGNOSIS — M542 Cervicalgia: Secondary | ICD-10-CM | POA: Diagnosis not present

## 2022-09-27 DIAGNOSIS — R059 Cough, unspecified: Secondary | ICD-10-CM | POA: Diagnosis not present

## 2022-09-27 DIAGNOSIS — J40 Bronchitis, not specified as acute or chronic: Secondary | ICD-10-CM | POA: Diagnosis not present

## 2022-09-27 DIAGNOSIS — Z6827 Body mass index (BMI) 27.0-27.9, adult: Secondary | ICD-10-CM | POA: Diagnosis not present

## 2022-10-21 DIAGNOSIS — J40 Bronchitis, not specified as acute or chronic: Secondary | ICD-10-CM | POA: Diagnosis not present

## 2022-10-21 DIAGNOSIS — R051 Acute cough: Secondary | ICD-10-CM | POA: Diagnosis not present

## 2022-10-21 DIAGNOSIS — I10 Essential (primary) hypertension: Secondary | ICD-10-CM | POA: Diagnosis not present

## 2022-10-21 DIAGNOSIS — B001 Herpesviral vesicular dermatitis: Secondary | ICD-10-CM | POA: Diagnosis not present

## 2022-10-21 DIAGNOSIS — J069 Acute upper respiratory infection, unspecified: Secondary | ICD-10-CM | POA: Diagnosis not present

## 2022-11-05 ENCOUNTER — Ambulatory Visit: Payer: PPO | Admitting: Cardiology

## 2022-11-05 ENCOUNTER — Encounter: Payer: Self-pay | Admitting: Cardiology

## 2022-11-05 VITALS — BP 133/83 | HR 64 | Ht 64.0 in | Wt 169.0 lb

## 2022-11-05 DIAGNOSIS — R42 Dizziness and giddiness: Secondary | ICD-10-CM

## 2022-11-05 DIAGNOSIS — I340 Nonrheumatic mitral (valve) insufficiency: Secondary | ICD-10-CM

## 2022-11-05 DIAGNOSIS — R0609 Other forms of dyspnea: Secondary | ICD-10-CM | POA: Diagnosis not present

## 2022-11-05 NOTE — Progress Notes (Signed)
Patient referred by Velna Hatchet, MD for shortness of breath, back pain, family h/o CAD.  Subjective:   Kelsey Cook, female    DOB: 02-06-1951, 72 y.o.   MRN: [id number]   Chief Complaint  Patient presents with   Shortness of Breath   Follow-up     HPI  72 y.o. Caucasian female with hypertension, hyperlipidemia, family history of early CAD, referred for evaluation of shortness of breath, diaphoresis.  Patients CTA was limited in evaluation of RCA and LCx, although calcium score was 0. No significant disease noted in LM and LAD.   She denies chest pain, exertional dyspnea, but repeats complaints of lightheadedness on standing up, associated with profuse sweating, similar to that noted in 2021.   Reviewed recent test results with the patient, details below.    Initial consultation HPI 09/2019: Patient is originally from [place], moved to [place] in 2015.  She is above risk your.  She walks 16 miles daily with her dogs at a leisurely pace.  Recently, she has experienced shortness of breath on walking.  She has also noticed pain in her back on walking, that is relieved with rest.  In addition, she has had episodes of profuse sweating for no particular reason, according to her.  Patient has significant family history of early coronary artery disease.  Several family members on maternal side have had MI in 24s and 55s.  Underwent coronary angiography 10 years ago, did not have any intervention, but simvastatin was changed to atorvastatin.  While on atorvastatin, she has had myalgias with elevated creatinine kinase of 585 noted in June 2020.  She stopped atorvastatin a week ago.     Current Outpatient Medications:    glucose blood test strip, TEST BLOOD SUGAR TWICE DAILY, Disp: , Rfl:    lisinopril (PRINIVIL,ZESTRIL) 10 MG tablet, Take 10 mg by mouth daily., Disp: , Rfl:    Multiple Vitamin (MULTIVITAMIN) capsule, Take 1 capsule by mouth daily. Centrum Silver for  WOmen, Disp: , Rfl:    omeprazole (PRILOSEC) 40 MG capsule, Take 40 mg by mouth daily., Disp: , Rfl:    rosuvastatin (CRESTOR) 10 MG tablet, TAKE 1 TABLET BY MOUTH EVERY DAY, Disp: 90 tablet, Rfl: 1   Cardiovascular and other pertinent studies:  EKG 11/05/2022: Sinus rhythm 64 bpm Normal EKG  Echocardiogram 02/05/2022: Normal LV systolic function with EF 56%. Left ventricle cavity is normal in size. Normal left ventricular wall thickness. Normal global wall motion. Normal diastolic filling pattern. Calculated EF 56%. Trileaflet aortic valve. Mild aortic valve leaflet calcification. No evidence of aortic stenosis. Trace aortic regurgitation. Structurally normal mitral valve.  Mild (Grade I) mitral regurgitation. Compared to the study done on 10/26/2019, mild to moderate regurgitation, mild pulmonary hypertension with PA pressure of 31 mmHg with dilated IVC is not present in the present study.  CTA coronary 12/17/2019: 1. Coronary calcium score of 0. This was 0 percentile for age and sex matched control. 2. Normal coronary origin with right dominance. 3. No evidence of atherosclerotic plaque in the LAD. The ostial/proximal RCA and mid to distal LCx are not adequately visualized due to motion artifact to rule out noncalcified plaque. 4. Recommend different imaging modality such as nuclear stress testing for further workup of chest pain.  CT Chest 07/31/2019: 1. Mild extrahepatic biliary dilatation with the common bile duct at 0.8 cm. The gallbladder appears unremarkable. 2. 2 hepatic cysts are present, and there is a third sub-centimeter hepatic hypodense lesion which is technically  too small to characterize, although statistically likely to be benign. 3. 4 mm right lower lobe pulmonary nodule and 2 mm lingular nodule. No follow-up needed if patient is low-risk (and has no known or suspected primary neoplasm). Non-contrast chest CT can be considered in 12 months if patient is  high-risk. This recommendation follows the consensus statement: Guidelines for Management of Incidental Pulmonary Nodules Detected on CT Images: From the Fleischner Society 2017; Radiology 2017; 284:228-243. 4. Other imaging findings of potential clinical significance: Aortic Atherosclerosis (ICD10-I70.0). Mild cardiomegaly. Mild left foraminal impingement at L5-S1.    Recent labs: 06/15/2022: Glucose NA, BUN/Cr 22/NA. EGFR 70. K 4.3. HbA1C NA Chol 17, TG 49, HDL 61, LDL 86 TSH 0.9 normal   05/08/2019: Glucose 99, BUN/Cr 25/0.8. EGFR 71. Na/K 138/4.5. Rest of the CMP normal H/H 13/42. MCV 94. Platelets 250 Chol 210, TG 60, HDL 64, LDL 134 Apolipoprotein B 95 (49-90) TSH normal  04/05/2019: CK 585 (32-182)    Review of Systems  Cardiovascular:  Negative for chest pain, dyspnea on exertion, leg swelling, palpitations and syncope.    Vitals:   11/05/22 1427  BP: 133/83  Pulse: 64  SpO2: 98%     Body mass index is 29.01 kg/m. Filed Weights   11/05/22 1427  Weight: 169 lb (76.7 kg)     Objective:   Physical Exam Vitals and nursing note reviewed.  Constitutional:      Appearance: She is well-developed.  Neck:     Vascular: No JVD.  Cardiovascular:     Rate and Rhythm: Normal rate and regular rhythm.     Pulses: Intact distal pulses.     Heart sounds: Normal heart sounds. No murmur heard. Pulmonary:     Effort: Pulmonary effort is normal.     Breath sounds: Normal breath sounds. No wheezing or rales.        Assessment & Recommendations:   72 y.o. Caucasian female with hypertension, hyperlipidemia, family history of early CAD, with lightheadedness, diaphoresis  Lightheadedness, diaphoresis: Patients CTA was limited in evaluation of RCA and LCx, although calcium score was 0 (2021). Current symptoms are not consistent with angina. Echocardiogram in 01/2022 was reassuring. Orthostatics are negative, but symptoms are likely related to postural  changes. Encourage liberal hydration.  Hyperlipidemia: Tolerating Crestor 10 mg well without any symptoms. CK normal in 10/2019. Continue annual lipid panel and refill statin with PCP.  Hypertension: Well controlled.  F/u w/me prn.   Nigel Mormon, MD Pager: [phone] Office: [phone]

## 2022-11-13 ENCOUNTER — Encounter: Payer: Self-pay | Admitting: Cardiology

## 2022-11-17 DIAGNOSIS — M542 Cervicalgia: Secondary | ICD-10-CM | POA: Diagnosis not present

## 2022-11-19 DIAGNOSIS — M1712 Unilateral primary osteoarthritis, left knee: Secondary | ICD-10-CM | POA: Diagnosis not present

## 2022-12-15 DIAGNOSIS — R29890 Loss of height: Secondary | ICD-10-CM | POA: Diagnosis not present

## 2022-12-15 DIAGNOSIS — Z8262 Family history of osteoporosis: Secondary | ICD-10-CM | POA: Diagnosis not present

## 2022-12-15 DIAGNOSIS — N958 Other specified menopausal and perimenopausal disorders: Secondary | ICD-10-CM | POA: Diagnosis not present

## 2022-12-15 DIAGNOSIS — M8588 Other specified disorders of bone density and structure, other site: Secondary | ICD-10-CM | POA: Diagnosis not present

## 2022-12-30 DIAGNOSIS — M5412 Radiculopathy, cervical region: Secondary | ICD-10-CM | POA: Diagnosis not present

## 2022-12-30 DIAGNOSIS — M542 Cervicalgia: Secondary | ICD-10-CM | POA: Diagnosis not present

## 2023-02-03 DIAGNOSIS — M1712 Unilateral primary osteoarthritis, left knee: Secondary | ICD-10-CM | POA: Diagnosis not present

## 2023-02-05 DIAGNOSIS — R300 Dysuria: Secondary | ICD-10-CM | POA: Diagnosis not present

## 2023-02-05 DIAGNOSIS — R319 Hematuria, unspecified: Secondary | ICD-10-CM | POA: Diagnosis not present

## 2023-02-25 DIAGNOSIS — Z8742 Personal history of other diseases of the female genital tract: Secondary | ICD-10-CM | POA: Diagnosis not present

## 2023-02-25 DIAGNOSIS — Z1231 Encounter for screening mammogram for malignant neoplasm of breast: Secondary | ICD-10-CM | POA: Diagnosis not present

## 2023-02-25 DIAGNOSIS — M8580 Other specified disorders of bone density and structure, unspecified site: Secondary | ICD-10-CM | POA: Diagnosis not present

## 2023-02-25 DIAGNOSIS — Z01419 Encounter for gynecological examination (general) (routine) without abnormal findings: Secondary | ICD-10-CM | POA: Diagnosis not present

## 2023-02-25 DIAGNOSIS — Z6828 Body mass index (BMI) 28.0-28.9, adult: Secondary | ICD-10-CM | POA: Diagnosis not present

## 2023-03-08 DIAGNOSIS — M1712 Unilateral primary osteoarthritis, left knee: Secondary | ICD-10-CM | POA: Diagnosis not present

## 2023-03-31 DIAGNOSIS — M810 Age-related osteoporosis without current pathological fracture: Secondary | ICD-10-CM | POA: Diagnosis not present

## 2023-03-31 DIAGNOSIS — I10 Essential (primary) hypertension: Secondary | ICD-10-CM | POA: Diagnosis not present

## 2023-03-31 DIAGNOSIS — E785 Hyperlipidemia, unspecified: Secondary | ICD-10-CM | POA: Diagnosis not present

## 2023-03-31 DIAGNOSIS — Z01818 Encounter for other preprocedural examination: Secondary | ICD-10-CM | POA: Diagnosis not present

## 2023-04-12 ENCOUNTER — Encounter: Payer: Self-pay | Admitting: Cardiology

## 2023-05-14 DIAGNOSIS — M25562 Pain in left knee: Secondary | ICD-10-CM | POA: Diagnosis not present

## 2023-05-21 DIAGNOSIS — M1711 Unilateral primary osteoarthritis, right knee: Secondary | ICD-10-CM | POA: Diagnosis not present

## 2023-05-21 DIAGNOSIS — M1712 Unilateral primary osteoarthritis, left knee: Secondary | ICD-10-CM | POA: Diagnosis not present

## 2023-05-21 DIAGNOSIS — G8918 Other acute postprocedural pain: Secondary | ICD-10-CM | POA: Diagnosis not present

## 2023-05-25 DIAGNOSIS — M25562 Pain in left knee: Secondary | ICD-10-CM | POA: Diagnosis not present

## 2023-05-25 DIAGNOSIS — M25662 Stiffness of left knee, not elsewhere classified: Secondary | ICD-10-CM | POA: Diagnosis not present

## 2023-05-27 DIAGNOSIS — M25662 Stiffness of left knee, not elsewhere classified: Secondary | ICD-10-CM | POA: Diagnosis not present

## 2023-05-27 DIAGNOSIS — M25562 Pain in left knee: Secondary | ICD-10-CM | POA: Diagnosis not present

## 2023-06-04 DIAGNOSIS — M25662 Stiffness of left knee, not elsewhere classified: Secondary | ICD-10-CM | POA: Diagnosis not present

## 2023-06-04 DIAGNOSIS — M25562 Pain in left knee: Secondary | ICD-10-CM | POA: Diagnosis not present

## 2023-06-08 DIAGNOSIS — Z96652 Presence of left artificial knee joint: Secondary | ICD-10-CM | POA: Diagnosis not present

## 2023-06-08 DIAGNOSIS — Z471 Aftercare following joint replacement surgery: Secondary | ICD-10-CM | POA: Diagnosis not present

## 2023-06-10 DIAGNOSIS — M25662 Stiffness of left knee, not elsewhere classified: Secondary | ICD-10-CM | POA: Diagnosis not present

## 2023-06-10 DIAGNOSIS — M25562 Pain in left knee: Secondary | ICD-10-CM | POA: Diagnosis not present

## 2023-06-14 DIAGNOSIS — M25562 Pain in left knee: Secondary | ICD-10-CM | POA: Diagnosis not present

## 2023-06-14 DIAGNOSIS — M25662 Stiffness of left knee, not elsewhere classified: Secondary | ICD-10-CM | POA: Diagnosis not present

## 2023-06-16 DIAGNOSIS — M25662 Stiffness of left knee, not elsewhere classified: Secondary | ICD-10-CM | POA: Diagnosis not present

## 2023-06-23 DIAGNOSIS — M25662 Stiffness of left knee, not elsewhere classified: Secondary | ICD-10-CM | POA: Diagnosis not present

## 2023-06-25 DIAGNOSIS — M25662 Stiffness of left knee, not elsewhere classified: Secondary | ICD-10-CM | POA: Diagnosis not present

## 2023-06-30 DIAGNOSIS — M25662 Stiffness of left knee, not elsewhere classified: Secondary | ICD-10-CM | POA: Diagnosis not present

## 2023-07-02 DIAGNOSIS — M25662 Stiffness of left knee, not elsewhere classified: Secondary | ICD-10-CM | POA: Diagnosis not present

## 2023-07-06 DIAGNOSIS — Z96652 Presence of left artificial knee joint: Secondary | ICD-10-CM | POA: Diagnosis not present

## 2023-07-06 DIAGNOSIS — Z471 Aftercare following joint replacement surgery: Secondary | ICD-10-CM | POA: Diagnosis not present

## 2023-07-06 DIAGNOSIS — M25662 Stiffness of left knee, not elsewhere classified: Secondary | ICD-10-CM | POA: Diagnosis not present

## 2023-07-20 DIAGNOSIS — M25562 Pain in left knee: Secondary | ICD-10-CM | POA: Diagnosis not present

## 2023-07-20 DIAGNOSIS — M25662 Stiffness of left knee, not elsewhere classified: Secondary | ICD-10-CM | POA: Diagnosis not present

## 2023-08-12 DIAGNOSIS — Z0000 Encounter for general adult medical examination without abnormal findings: Secondary | ICD-10-CM | POA: Diagnosis not present

## 2023-08-12 DIAGNOSIS — I10 Essential (primary) hypertension: Secondary | ICD-10-CM | POA: Diagnosis not present

## 2023-08-12 DIAGNOSIS — E1151 Type 2 diabetes mellitus with diabetic peripheral angiopathy without gangrene: Secondary | ICD-10-CM | POA: Diagnosis not present

## 2023-08-12 DIAGNOSIS — E785 Hyperlipidemia, unspecified: Secondary | ICD-10-CM | POA: Diagnosis not present

## 2023-08-12 DIAGNOSIS — E559 Vitamin D deficiency, unspecified: Secondary | ICD-10-CM | POA: Diagnosis not present

## 2023-08-19 DIAGNOSIS — K219 Gastro-esophageal reflux disease without esophagitis: Secondary | ICD-10-CM | POA: Diagnosis not present

## 2023-08-19 DIAGNOSIS — I10 Essential (primary) hypertension: Secondary | ICD-10-CM | POA: Diagnosis not present

## 2023-08-19 DIAGNOSIS — Z1339 Encounter for screening examination for other mental health and behavioral disorders: Secondary | ICD-10-CM | POA: Diagnosis not present

## 2023-08-19 DIAGNOSIS — R079 Chest pain, unspecified: Secondary | ICD-10-CM | POA: Diagnosis not present

## 2023-08-19 DIAGNOSIS — M1990 Unspecified osteoarthritis, unspecified site: Secondary | ICD-10-CM | POA: Diagnosis not present

## 2023-08-19 DIAGNOSIS — Z0000 Encounter for general adult medical examination without abnormal findings: Secondary | ICD-10-CM | POA: Diagnosis not present

## 2023-08-19 DIAGNOSIS — I340 Nonrheumatic mitral (valve) insufficiency: Secondary | ICD-10-CM | POA: Diagnosis not present

## 2023-08-19 DIAGNOSIS — M810 Age-related osteoporosis without current pathological fracture: Secondary | ICD-10-CM | POA: Diagnosis not present

## 2023-08-19 DIAGNOSIS — Z1331 Encounter for screening for depression: Secondary | ICD-10-CM | POA: Diagnosis not present

## 2023-08-19 DIAGNOSIS — I700 Atherosclerosis of aorta: Secondary | ICD-10-CM | POA: Diagnosis not present

## 2023-08-19 DIAGNOSIS — E785 Hyperlipidemia, unspecified: Secondary | ICD-10-CM | POA: Diagnosis not present

## 2023-08-19 DIAGNOSIS — M25562 Pain in left knee: Secondary | ICD-10-CM | POA: Diagnosis not present

## 2023-08-20 DIAGNOSIS — M25512 Pain in left shoulder: Secondary | ICD-10-CM | POA: Diagnosis not present

## 2023-10-06 DIAGNOSIS — Z1152 Encounter for screening for COVID-19: Secondary | ICD-10-CM | POA: Diagnosis not present

## 2023-10-06 DIAGNOSIS — R0981 Nasal congestion: Secondary | ICD-10-CM | POA: Diagnosis not present

## 2023-10-06 DIAGNOSIS — R5383 Other fatigue: Secondary | ICD-10-CM | POA: Diagnosis not present

## 2023-10-06 DIAGNOSIS — J029 Acute pharyngitis, unspecified: Secondary | ICD-10-CM | POA: Diagnosis not present

## 2023-10-06 DIAGNOSIS — R058 Other specified cough: Secondary | ICD-10-CM | POA: Diagnosis not present

## 2023-10-06 DIAGNOSIS — J209 Acute bronchitis, unspecified: Secondary | ICD-10-CM | POA: Diagnosis not present

## 2023-11-02 DIAGNOSIS — E785 Hyperlipidemia, unspecified: Secondary | ICD-10-CM | POA: Diagnosis not present

## 2023-11-02 DIAGNOSIS — R062 Wheezing: Secondary | ICD-10-CM | POA: Diagnosis not present

## 2023-11-02 DIAGNOSIS — R058 Other specified cough: Secondary | ICD-10-CM | POA: Diagnosis not present

## 2023-11-02 DIAGNOSIS — I10 Essential (primary) hypertension: Secondary | ICD-10-CM | POA: Diagnosis not present

## 2023-11-02 DIAGNOSIS — J209 Acute bronchitis, unspecified: Secondary | ICD-10-CM | POA: Diagnosis not present

## 2023-11-02 DIAGNOSIS — J309 Allergic rhinitis, unspecified: Secondary | ICD-10-CM | POA: Diagnosis not present

## 2024-01-11 DIAGNOSIS — J101 Influenza due to other identified influenza virus with other respiratory manifestations: Secondary | ICD-10-CM | POA: Diagnosis not present

## 2024-01-11 DIAGNOSIS — R5383 Other fatigue: Secondary | ICD-10-CM | POA: Diagnosis not present

## 2024-01-11 DIAGNOSIS — R058 Other specified cough: Secondary | ICD-10-CM | POA: Diagnosis not present

## 2024-01-11 DIAGNOSIS — R0981 Nasal congestion: Secondary | ICD-10-CM | POA: Diagnosis not present

## 2024-01-11 DIAGNOSIS — Z1152 Encounter for screening for COVID-19: Secondary | ICD-10-CM | POA: Diagnosis not present

## 2024-03-22 ENCOUNTER — Telehealth: Payer: Self-pay | Admitting: Cardiology

## 2024-03-22 NOTE — Telephone Encounter (Signed)
 Called patient left message on personal voice mail office is presently closed.Advised if you are having active chest pain you should go to ED to be evaluated.Advised to call back tomorrow to discuss symptoms.

## 2024-03-22 NOTE — Telephone Encounter (Signed)
 Pt c/o of Chest Pain: STAT if active (IN THIS MOMENT) CP, including tightness, pressure, jaw pain, shoulder/upper arm/back pain, SOB, nausea, and vomiting.  1. Are you having CP right now (tightness, pressure, or discomfort)? No   2. Are you experiencing any other symptoms (ex. SOB, nausea, vomiting, sweating)? No   3. How long have you been experiencing CP? Week and a half   4. Is your CP continuous or coming and going? Coming and going   5. Have you taken Nitroglycerin ? No   6. If CP returns before callback, please consider calling 911. ?

## 2024-03-23 NOTE — Telephone Encounter (Signed)
 Left message to call back

## 2024-03-24 NOTE — Telephone Encounter (Signed)
 Left message to return call on patient's phone and on patient's alternative contact number.

## 2024-03-27 DIAGNOSIS — Z1231 Encounter for screening mammogram for malignant neoplasm of breast: Secondary | ICD-10-CM | POA: Diagnosis not present

## 2024-03-27 DIAGNOSIS — Z01419 Encounter for gynecological examination (general) (routine) without abnormal findings: Secondary | ICD-10-CM | POA: Diagnosis not present

## 2024-03-27 DIAGNOSIS — M8580 Other specified disorders of bone density and structure, unspecified site: Secondary | ICD-10-CM | POA: Diagnosis not present

## 2024-03-27 DIAGNOSIS — Z6829 Body mass index (BMI) 29.0-29.9, adult: Secondary | ICD-10-CM | POA: Diagnosis not present

## 2024-03-27 NOTE — Telephone Encounter (Signed)
 Left message to call back. After several attempts of trying to contact the patient I will wait for the patient to return call.

## 2024-03-28 NOTE — Telephone Encounter (Signed)
 Last seen by me in 10/2022.  Prior workup unremarkable.  In severe chest pain, recommend ER evaluation.  If not, I am happy to see her sooner than her currently scheduled appointment on 05/01/2024.  Thanks MJP

## 2024-04-20 DIAGNOSIS — K625 Hemorrhage of anus and rectum: Secondary | ICD-10-CM | POA: Diagnosis not present

## 2024-04-20 DIAGNOSIS — K649 Unspecified hemorrhoids: Secondary | ICD-10-CM | POA: Diagnosis not present

## 2024-04-20 DIAGNOSIS — L290 Pruritus ani: Secondary | ICD-10-CM | POA: Diagnosis not present

## 2024-04-20 DIAGNOSIS — R197 Diarrhea, unspecified: Secondary | ICD-10-CM | POA: Diagnosis not present

## 2024-04-20 DIAGNOSIS — I10 Essential (primary) hypertension: Secondary | ICD-10-CM | POA: Diagnosis not present

## 2024-05-01 ENCOUNTER — Ambulatory Visit: Attending: Cardiology | Admitting: Cardiology

## 2024-05-01 NOTE — Progress Notes (Deleted)
  Cardiology Office Note:  .   Date:  05/01/2024  ID:  Stephane Rower, DOB 1951/05/18, MRN [id number] PCP: Larnell Hamilton, MD  [facility] Providers Cardiologist:  Newman Lawrence, MD PCP: Larnell Hamilton, MD  No chief complaint on file.    Maryuri Kleinschmidt is a 73 y.o. female with hypertension, hyperlipidemia, family history of early CAD,   Discussed the use of AI scribe software for clinical note transcription with the patient, who gave verbal consent to proceed.  History of Present Illness       There were no vitals filed for this visit.    ROS      Studies Reviewed: .        *** Labs ***/202***: Chol ***, TG ***, HDL ***, LDL *** HbA1C ***% Hb *** Cr *** ***  Echocardiogram 2023: Normal LV systolic function with EF 56%. Left ventricle cavity is normal in size. Normal left ventricular wall thickness. Normal global wall motion. Normal diastolic filling pattern. Calculated EF 56%. Trileaflet aortic valve. Mild aortic valve leaflet calcification. No evidence of aortic stenosis. Trace aortic regurgitation. Structurally normal mitral valve.  Mild (Grade I) mitral regurgitation. Compared to the study done on 10/26/2019, mild to moderate regurgitation, mild pulmonary hypertension with PA pressure of 31 mmHg with dilated IVC is not present in the present study.  CCTA 2021: 1. Coronary calcium  score of 0. This was 0 percentile for age and sex matched control. 2. Normal coronary origin with right dominance. 3. No evidence of atherosclerotic plaque in the LAD. The ostial/proximal RCA and mid to distal LCx are not adequately visualized due to motion artifact to rule out noncalcified plaque. 4. Recommend different imaging modality such as nuclear stress testing for further workup of chest pain.    Risk Assessment/Calculations:   {Does this patient have ATRIAL FIBRILLATION?:[id number]}    Physical Exam   VISIT DIAGNOSES: No diagnosis  found.   Jerzee Liska is a 73 y.o. female with hypertension, hyperlipidemia, family history of early CAD  Assessment & Plan  *** Lightheadedness, diaphoresis: Patients CTA was limited in evaluation of RCA and LCx, although calcium  score was 0 (2021). Current symptoms are not consistent with angina. Echocardiogram in 01/2022 was reassuring. Orthostatics are negative, but symptoms are likely related to postural changes. Encourage liberal hydration.   *** Hyperlipidemia: Tolerating Crestor  10 mg well without any symptoms. CK normal in 10/2019. Continue annual lipid panel and refill statin with PCP.   *** Hypertension: Well controlled.     {Are you ordering a CV Procedure (e.g. stress test, cath, DCCV, TEE, etc)?   Press F2        :[id number]}    No orders of the defined types were placed in this encounter.    F/u in ***  Signed, Newman JINNY Lawrence, MD

## 2024-07-12 DIAGNOSIS — M25531 Pain in right wrist: Secondary | ICD-10-CM | POA: Diagnosis not present

## 2024-07-12 DIAGNOSIS — M810 Age-related osteoporosis without current pathological fracture: Secondary | ICD-10-CM | POA: Diagnosis not present

## 2024-07-12 DIAGNOSIS — R030 Elevated blood-pressure reading, without diagnosis of hypertension: Secondary | ICD-10-CM | POA: Diagnosis not present

## 2024-07-12 DIAGNOSIS — Z6828 Body mass index (BMI) 28.0-28.9, adult: Secondary | ICD-10-CM | POA: Diagnosis not present

## 2024-09-12 ENCOUNTER — Encounter: Payer: Self-pay | Admitting: *Deleted

## 2024-09-13 ENCOUNTER — Ambulatory Visit: Attending: Emergency Medicine | Admitting: Emergency Medicine

## 2024-09-13 ENCOUNTER — Encounter: Payer: Self-pay | Admitting: Emergency Medicine

## 2024-09-13 VITALS — BP 124/70 | HR 66 | Ht 65.5 in | Wt 172.0 lb

## 2024-09-13 DIAGNOSIS — E782 Mixed hyperlipidemia: Secondary | ICD-10-CM

## 2024-09-13 DIAGNOSIS — I10 Essential (primary) hypertension: Secondary | ICD-10-CM

## 2024-09-13 DIAGNOSIS — E785 Hyperlipidemia, unspecified: Secondary | ICD-10-CM | POA: Diagnosis not present

## 2024-09-13 DIAGNOSIS — M810 Age-related osteoporosis without current pathological fracture: Secondary | ICD-10-CM | POA: Diagnosis not present

## 2024-09-13 DIAGNOSIS — R0609 Other forms of dyspnea: Secondary | ICD-10-CM | POA: Diagnosis not present

## 2024-09-13 DIAGNOSIS — K219 Gastro-esophageal reflux disease without esophagitis: Secondary | ICD-10-CM | POA: Diagnosis not present

## 2024-09-13 DIAGNOSIS — I340 Nonrheumatic mitral (valve) insufficiency: Secondary | ICD-10-CM | POA: Diagnosis not present

## 2024-09-13 DIAGNOSIS — R7309 Other abnormal glucose: Secondary | ICD-10-CM | POA: Diagnosis not present

## 2024-09-13 LAB — LAB REPORT - SCANNED
A1c: 5.8
EGFR (Non-African Amer.): 70.5

## 2024-09-13 NOTE — Progress Notes (Signed)
 Cardiology Office Note:    Date:  09/13/2024  ID:  Kelsey Cook, DOB [birth year], MRN [id number] PCP: Larnell Hamilton, MD   [facility] Providers Cardiologist:  Newman JINNY Lawrence, MD       Patient Profile:       Chief Complaint: 1 year follow-up History of Present Illness:  Kelsey Cook is a 73 y.o. female with visit-pertinent history of hypertension, hyperlipidemia  She underwent coronary CTA on 11/2019 showing coronary calcium  score of 0.  The ostial/proximal RCA and mid to distal LCx were not adequately visualized due to motion artifact to rule out noncalcified plaque.  Echocardiogram 01/2022 with LVEF of 56% and mild mitral valve regurgitation.  Last seen in clinic on 11/05/2022 by Dr. Lawrence.  Patient had reported lightheadedness at the time.  Orthostatics were negative but symptoms likely related to postural changes.  Hydration was encouraged.  She was to follow-up as needed.   Discussed the use of AI scribe software for clinical note transcription with the patient, who gave verbal consent to proceed.  History of Present Illness Kelsey Cook is a 73 year old female with presents for 1 year follow-up.  Today she is doing well without acute concerns.  She continues to experience diaphoresis and fatigue, often waking up at night feeling sweaty. She attributes these symptoms to stress, mold in her home, and poor sleep.   She denies any chest pains or any exertional symptoms.  Tells me she continues to stay active without any limitations.  She denies any syncope, presyncope, lightheadedness, dizziness, palpitations, orthopnea, PND  Her current medications include Crestor  for cholesterol and lisinopril for blood pressure. She quit smoking in 2009. Family history is significant for heart disease and diabetes.  She tells me that she is leaving [place] and moving to [place] .  She is moving within the next week.  She plans to establish with cardiology service in  [place] .   Review of systems:  Please see the history of present illness. All other systems are reviewed and otherwise negative.      Studies Reviewed:    EKG Interpretation Date/Time:  Wednesday September 13 2024 13:38:27 EST Ventricular Rate:  66 PR Interval:  124 QRS Duration:  70 QT Interval:  382 QTC Calculation: 400 R Axis:   66  Text Interpretation: Normal sinus rhythm Normal ECG No previous ECGs available Confirmed by Rana Dixon [phone]) on 09/13/2024 1:39:55 PM    Echocardiogram 02/05/2022 Normal LV systolic function with EF 56%. Left ventricle cavity is normal  in size. Normal left ventricular wall thickness. Normal global wall  motion. Normal diastolic filling pattern. Calculated EF 56%.  Trileaflet aortic valve. Mild aortic valve leaflet calcification. No  evidence of aortic stenosis. Trace aortic regurgitation.  Structurally normal mitral valve.  Mild (Grade I) mitral regurgitation.  Compared to the study done on 10/26/2019, mild to moderate regurgitation,  mild pulmonary hypertension with PA pressure of 31 mmHg with dilated IVC  is not present in the present study.   Coronary CTA 11/27/2019 1. Coronary calcium  score of 0. This was 0 percentile for age and sex matched control.   2. Normal coronary origin with right dominance.   3. No evidence of atherosclerotic plaque in the LAD. The ostial/proximal RCA and mid to distal LCx are not adequately visualized due to motion artifact to rule out noncalcified plaque.   4. Recommend different imaging modality such as nuclear stress testing for further workup of chest pain. Risk Assessment/Calculations:  Physical Exam:   VS:  BP 124/70 (BP Location: Left Arm, Patient Position: Sitting, Cuff Size: Normal)   Pulse 66   Ht 5' 5.5 (1.664 m)   Wt 172 lb (78 kg)   LMP  (LMP Unknown)   BMI 28.19 kg/m    Wt Readings from Last 3 Encounters:  09/13/24 172 lb (78 kg)  11/05/22 169 lb (76.7 kg)   02/05/20 167 lb (75.8 kg)    GEN: Well nourished, well developed in no acute distress NECK: No JVD; No carotid bruits CARDIAC: RRR, no murmurs, rubs, gallops RESPIRATORY:  Clear to auscultation without rales, wheezing or rhonchi  ABDOMEN: Soft, non-tender, non-distended EXTREMITIES:  No edema; No acute deformity      Assessment and Plan:  Hyperlipidemia LDL 63 on 07/2023 and under excellent control - Continue rosuvastatin  10 mg daily - Will have repeat lipid panel and LFTs drawn with her primary care provider this week  Hypertension Blood pressure today is well-controlled at 124/70 - Continue lisinopril 10 mg daily  Mild mitral valve regurgitation Echocardiogram 01/2022 showed mild mitral valve regurgitation - Today she is stable and asymptomatic without complaints of chest pains, dyspnea, or syncope - Can repeat echocardiogram in 1 to 3 years for routine monitoring  Exertional dyspnea Diaphoresis Coronary CTA 11/2019 with CAC of 0 Echocardiogram 01/2022 with LVEF 56% and no RWMA - EKG today without acute ischemic changes - Today she reports her exertional dyspnea has resolved.  Diaphoresis has been ongoing for several years typically occurring in the morning.  She denies any chest pains or exertional symptoms - She is without any symptoms concerning for active angina. No indication for further ischemic evaluation at this time      Dispo: She is moving to [place]  next week and plans to establish with cardiology practice there.  Can follow-up with our practice as needed  Signed, Lum LITTIE Louis, NP

## 2024-09-13 NOTE — Patient Instructions (Signed)
 Medication Instructions:  NO CHANGES  Lab Work: NONE TO BE DONE TODAY.  Testing/Procedures: NONE  Follow-Up: At [facility], you and your health needs are our priority.  As part of our continuing mission to provide you with exceptional heart care, our providers are all part of one team.  This team includes your primary Cardiologist (physician) and Advanced Practice Providers or APPs (Physician Assistants and Nurse Practitioners) who all work together to provide you with the care you need, when you need it.  Your next appointment:   AS NEEDED  Provider:   MADISON FOUNTAIN, NP

## 2024-09-18 DIAGNOSIS — R82998 Other abnormal findings in urine: Secondary | ICD-10-CM | POA: Diagnosis not present
